# Patient Record
Sex: Male | Born: 1950 | Race: White | Hispanic: No | Marital: Married | Smoking: Current every day smoker
Health system: Southern US, Community
[De-identification: ages and names within clinical notes are randomized; demographics above are authoritative.]

## PROBLEM LIST (undated history)

## (undated) ENCOUNTER — Emergency Department: Discharge status: Absent without leave | Payer: BC Managed Care – PPO

## (undated) DIAGNOSIS — J449 Chronic obstructive pulmonary disease, unspecified: Secondary | ICD-10-CM

## (undated) DIAGNOSIS — E871 Hypo-osmolality and hyponatremia: Secondary | ICD-10-CM

## (undated) DIAGNOSIS — F329 Major depressive disorder, single episode, unspecified: Secondary | ICD-10-CM

## (undated) DIAGNOSIS — G629 Polyneuropathy, unspecified: Secondary | ICD-10-CM

## (undated) DIAGNOSIS — I714 Abdominal aortic aneurysm, without rupture, unspecified: Secondary | ICD-10-CM

## (undated) DIAGNOSIS — F32A Depression, unspecified: Secondary | ICD-10-CM

## (undated) DIAGNOSIS — Z720 Tobacco use: Secondary | ICD-10-CM

## (undated) HISTORY — DX: Abdominal aortic aneurysm, without rupture, unspecified: I71.40

## (undated) HISTORY — DX: Polyneuropathy, unspecified: G62.9

## (undated) HISTORY — PX: HEMORRHOID SURGERY: SHX153

## (undated) HISTORY — DX: Hypo-osmolality and hyponatremia: E87.1

---

## 2002-07-18 ENCOUNTER — Encounter: Payer: Self-pay | Admitting: Emergency Medicine

## 2002-07-18 ENCOUNTER — Inpatient Hospital Stay (HOSPITAL_COMMUNITY): Admission: EM | Admit: 2002-07-18 | Discharge: 2002-07-19 | Payer: Self-pay | Admitting: Emergency Medicine

## 2002-07-19 ENCOUNTER — Encounter: Payer: Self-pay | Admitting: Internal Medicine

## 2002-08-05 ENCOUNTER — Encounter: Admission: RE | Admit: 2002-08-05 | Discharge: 2002-08-05 | Payer: Self-pay | Admitting: Internal Medicine

## 2004-08-21 ENCOUNTER — Emergency Department (HOSPITAL_COMMUNITY): Admission: EM | Admit: 2004-08-21 | Discharge: 2004-08-21 | Payer: Self-pay | Admitting: Emergency Medicine

## 2005-02-27 ENCOUNTER — Encounter (INDEPENDENT_AMBULATORY_CARE_PROVIDER_SITE_OTHER): Payer: Self-pay | Admitting: *Deleted

## 2005-02-27 ENCOUNTER — Ambulatory Visit (HOSPITAL_COMMUNITY): Admission: RE | Admit: 2005-02-27 | Discharge: 2005-02-27 | Payer: Self-pay | Admitting: General Surgery

## 2005-04-13 ENCOUNTER — Encounter (INDEPENDENT_AMBULATORY_CARE_PROVIDER_SITE_OTHER): Payer: Self-pay | Admitting: Specialist

## 2005-04-13 ENCOUNTER — Ambulatory Visit (HOSPITAL_COMMUNITY): Admission: RE | Admit: 2005-04-13 | Discharge: 2005-04-13 | Payer: Self-pay | Admitting: General Surgery

## 2006-05-08 HISTORY — PX: HEMORRHOID SURGERY: SHX153

## 2007-01-06 ENCOUNTER — Emergency Department (HOSPITAL_COMMUNITY): Admission: EM | Admit: 2007-01-06 | Discharge: 2007-01-06 | Payer: Self-pay | Admitting: Emergency Medicine

## 2010-09-23 NOTE — Discharge Summary (Signed)
NAME:  Jacob Barr, Jacob Barr NO.:  [id number]   MEDICAL RECORD NO.:  [id number]                   PATIENT TYPE:  INP   LOCATION:  3729                                 FACILITY:  [facility]   PHYSICIAN:  Douglass Rivers, M.D.                DATE OF BIRTH:  08-01-1950   DATE OF ADMISSION:  07/18/2002  DATE OF DISCHARGE:  07/19/2002                                 DISCHARGE SUMMARY   DISCHARGE DIAGNOSES:  1. Episodic nausea with associated diaphoresis and dizziness times three.  2. Tobacco abuse.  3. Question of nodule on portable chest x-ray on July 18, 2002, attributed     to blood vessel and repeat PA and lateral on July 19, 2002.   DISCHARGE MEDICATIONS:  Enteric coated aspirin 325 mg by mouth each day.   OTHER INSTRUCTIONS:  The patient was instructed to return immediately for  evaluation if he developed sudden shortness of breath, chest pain or repeat  of his previous symptoms. I strongly encouraged the patient to consider  stopping smoking and to seek help with his primary care physician about  options that could help him.   FOLLOW UP:  1. [facility]. They are to call the patient with an appointment for     a stress test.  2. The patient has a hospital follow-up at the [facility] on August 05, 2002.  3. The patient was instructed to call Dr. Jeannetta Nap' office for an appointment     in approximately 3-4 weeks.   HISTORY OF PRESENT ILLNESS:  Mr. Shinault is a 60 year old white male smoker  who presented to the [facility] emergency room with a 10-15 minute  episode of nausea associated with dizziness on the morning prior to  admission. This episode was not exertional and relieved by fresh air. It  subsided gradually and at the time of admission had completely resolved. He  has had two previous similar episodes separated by a few weeks. They both  occurred while he was having a bowel movement on the commode.  These two  episodes were also associated with diaphoresis in addition to weakness,  dizziness, and nausea. The patient denies any chest pain or palpitations.  Denies any shortness of breath. Denies vomiting. He did admit to having  numbness to the little finger of his right hand on the morning upon awaking  on the day of admission. This numbness self subsided. The patient denies any  past history of diabetes mellitus, hypertension, or heart disease. He states  that he had chest pain in the past but says that the workup was negative and  does not recall any testing beyond EKG. There is no family history of  coronary artery disease.   MEDICATIONS:  The patient started taking 81 mg of aspirin per day two days  prior to admission.   SOCIAL HISTORY:  Significant for a 60 pack per year history of cigarette  smoking. The patient is married and does not currently have health  insurance.   REVIEW OF SYSTEMS:  As per HPI.   PHYSICAL EXAMINATION:  VITAL SIGNS: Pulse 101, respiratory rate 20, blood  pressure 137/65, temperature 97.0, and O2 sat 100% on room air.  GENERAL: Awake, alert, and in no acute distress. Pleasant, thin, well  nourished.  HEENT: Eyes were clear. Conjunctiva pink. Extraocular muscles intact.  NECK: Normal range of motion. No LAD.  LUNGS: Clear to auscultation and percussion bilaterally. Expiratory  respirations were slightly increased.  CARDIAC: Regular rate and rhythm but distant. No murmur, rub, or gallop. EKG  revealed sinus bradycardia with a rate of 55. There are no ST changes.  ABDOMEN: Benign.  EXTREMITIES: Showed strong 2+ radial and dorsalis pedis pulse. The patient  did have decreased hair to his bilateral feet.  NEURO: Showed no focal deficits.  RECTAL: Revealed heme negative. Normal tone.   LABORATORY DATA:  Normal creatinine of 1.2. WBC count 10.2. Hemoglobin 14.1.  Initial and subsequent cardiac enzymes were all negative with troponin I of  less than  0.01.   DIAGNOSTIC IMPRESSION:  Portable x-ray showed a 5 mm nodule in the left  lower lobe.   ASSESSMENT:  A 60 year old male with the only cardiac risk factor of long  smoking history who presented with a history of episodic nausea and  diaphoresis. The following issues were addressed.  1. Nausea/diaphoresis. As this was considered an atypical presentation of     cardiac ischemia, however, due to his risk factors of being male and     being a smoker, the patient was admitted for observation and to rule out     myocardial infarction with serial EKG's, telemetry and enzymes. Also     arrhythmia was to be ruled out with telemetry. The patient had no     evidence of acute myocardial infarction. He had no repeat episodes of his     symptoms during the hospital course. At the time of discharge, he was     feeling well. Due to his risk for smoking and age, it was recommended     that the patient had a stress test for risk stratification. Included in     the workup was a TSH that was normal at 0.706 and a fasting lipid panel     with total cholesterol of 187, triglycerides 77, HDL 49, and LDL 123.  2. Tobacco abuse. The patient was repeatedly approached and encouraged to     consider the association of tobacco use. A Nicotine patch was offered.     However, the patient declined. The patient appeared to be in the pre     contemplating stage of this behavior change. Would recommend continuing     to address this as it would be beneficial to his future health.  3. A 5 mm nodule in the left lower lobe. Initially this was detected on a     portable film done in the emergency room. Initially radiology recommended     just getting a repeat film in approximately 3-6 months or comparing it to     an old film. On hospital day two, the patient underwent a repeat chest x-     ray with a PA and lateral which did not reveal this same said nodule. At    that time, radiology felt that the initial nodule was  likely a blood  vessel. No further follow-up was recommended.   DISPOSITION:  The patient was discharged to home with previously described  recommendations and follow-up.                                                Douglass Rivers, M.D.    CH/MEDQ  D:  07/22/2002  T:  07/23/2002  Job:  [id number]   cc:   Buren Kos, M.D.  [address] [address]  [place], [place]  [address]  Fax: [phone]

## 2010-09-23 NOTE — Op Note (Signed)
NAME:  Jacob Barr, Jacob Barr               ACCOUNT NO.:  [id number]   MEDICAL RECORD NO.:  [id number]          PATIENT TYPE:  AMB   LOCATION:  DAY                          FACILITY:  [facility]   PHYSICIAN:  Ollen Gross. Vernell Morgans, M.D. DATE OF BIRTH:  20-Feb-1951   DATE OF PROCEDURE:  03/01/2005  DATE OF DISCHARGE:  02/27/2005                                 OPERATIVE REPORT   PREOPERATIVE DIAGNOSIS:  Internal prolapsing hemorrhoids.   POSTOP DIAGNOSIS:  Internal prolapsing hemorrhoids.   PROCEDURE:  PPH hemorrhoidectomy.   SURGEON:  Ollen Gross. Carolynne Edouard, M.D.   ANESTHESIA:  General endotracheal.   DESCRIPTION OF PROCEDURE:  After informed consent was obtained, the patient  was brought to the operating room and left in supine position on stretcher.  After adequate induction of general anesthesia, the patient was moved onto  the operating room table into a prone position; and all pressure points were  padded.  His buttock were retracted laterally with tape. The perirectal area  was then prepped with Betadine and draped in he usual sterile manner.  Initially a small bullet retractor was placed in the rectum and the patient  was noted to have internal hemorrhoids, but no other abnormalities were  noted. The perirectal area was then infiltrated with 1 mL of Wydase as well  as 9 mL of 1/4% Marcaine with epinephrine; and the tissue was massaged  gently.   Next a deep Fansler retractor was placed in the rectum. A 2-0 Prolene  pursestring stitch was then placed approximately 4-5 cm deep to the dentate  line. Care was taken to make sure each stitch started where the previous  stitch had ended. Once this pursestring stitch was in good position, the  Fansler retractor was removed. Care was also taken to make sure each stitch  gathered only mucosa and submucosa and no muscle. Next the ends of the  pursestring stitch were brought through the clear plastic retractor and  white dilator.  The retractor and dilator were  then placed in the rectum.  The white dilator was removed. Next, the [facility] stapling device was placed in  the rectum and felt to have dropped beneath the pursestring stitch. The  pursestring stitch was then cinched down and tied; and the ends of the  pursestring stitch were brought to the lateral holes in the [facility] stapling  device. An air knot was then tied as well as a hemostat applied for  retraction purposes on the ends of the pursestring stitch.  The PPH stapling  device was then advanced into the rectum and closed, gradually, all way.  Once this was accomplished, the stapling device appeared to be in very good  position. A minute was allowed to pass; the stapling device was then fired;  another minute was allowed to pass.  The PPH stapling device was then opened  about a half-turn and then removed from the rectum.  The specimen was then  examined. There was a complete ring that was very uniform with hemorrhoidal  tissue but no muscle. This was sent to pathology for further evaluation.   Next  the shorter Fansler retractor was used to examine the staple line.  Several bleeding points were controlled with figure-of-eight 4-0 Vicryl  stitches. Once this was accomplished, the staple line was very hemostatic  and looked good. The perirectal area was then infiltrated with more 1/4%  Marcaine with epinephrine; 1% lidocaine jelly was  placed inside the rectum along with a small piece of Gelfoam; and sterile  dressings were applied. The patient tolerated well.  At the end of the case  all needle, sponge and instrument counts were correct. The patient was then  moved back into a supine position, awakened, taken recovery in stable  condition.      Ollen Gross. Vernell Morgans, M.D.  Electronically Signed     PST/MEDQ  D:  03/01/2005  T:  03/01/2005  Job:  [id number]

## 2010-09-23 NOTE — Op Note (Signed)
NAME:  Jacob Barr, Jacob Barr               ACCOUNT NO.:  [id number]   MEDICAL RECORD NO.:  [id number]          PATIENT TYPE:  AMB   LOCATION:  DAY                          FACILITY:  [facility]   PHYSICIAN:  Ollen Gross. Vernell Morgans, M.D. DATE OF BIRTH:  04/16/1951   DATE OF PROCEDURE:  04/13/2005  DATE OF DISCHARGE:  04/13/2005                                 OPERATIVE REPORT   PREOPERATIVE DIAGNOSIS:  Prolapsing internal and external hemorrhoids.   POSTOPERATIVE DIAGNOSIS:  Prolapsing internal and external hemorrhoids.   PROCEDURE:  Two-column hemorrhoidectomy and internal hemorrhoid banding x1.   SURGEON:  Dr. Carolynne Edouard   ANESTHESIA:  General endotracheal.   PROCEDURE:  After informed consent was obtained, the patient was brought to  the operating room and placed in a supine position on the operating room  table.  After adequate induction of general anesthesia, the patient's  perirectal area was prepped with Betadine and draped in usual sterile  manner.  The patient's perirectal area was then infiltrated with 0.25%  Marcaine with epinephrine and 1 mL of Wydase, and the tissue was massaged  gently for several minutes.  Next, on digital exam, no masses could be  appreciated.  A bullet-type retractor was then placed within the rectum, and  the rectum was examined circumferentially.  There were two prominent areas  of internal and external hemorrhoidal tissue that seemed to prolapse when  the patient strains.  These were in the right posterior and left lateral  positions.  Each of these hemorrhoids was grasped in 2 places with Allis  clamps and elevated.  The base of the hemorrhoid at each position was then  scored with the 15 blade knife.  A hemostat was placed across the base of  the hemorrhoidal tissue and clamped.  The hemorrhoid was then excised  sharply with the Metzenbaum scissors.  Then 2-0 chromic stitches were used  to close each of these incisions in a running manner.  The last 3 mm or so  of  the wound was left open for drainage.  An extra interrupted stitch was  required on the left lateral stitch lines for hemostasis.  Once this was  accomplished though, both incision lines were very hemostatic and looked  good.  There was some prominent internal hemorrhoidal tissue anteriorly, and  this was controlled with a band.  The patient tolerated the procedure well.  At the end of the case, all needle, sponge, and instrument counts were  correct.  Lidocaine jelly and a small piece of Gelfoam were then placed  inside the rectum, and sterile dressings were applied.  The patient was then  awakened and taken to the recovery room in stable condition.      Ollen Gross. Vernell Morgans, M.D.  Electronically Signed     PST/MEDQ  D:  04/17/2005  T:  04/17/2005  Job:  [id number]

## 2011-02-17 LAB — POCT CARDIAC MARKERS
CKMB, poc: <1 — ABNORMAL LOW
Myoglobin, poc: 63.3
Operator id: 151321
Troponin i, poc: <0.05

## 2011-02-17 LAB — DIFFERENTIAL
Basophils Absolute: 0
Basophils Relative: 0
Eosinophils Absolute: 0
Eosinophils Relative: 0
Lymphocytes Relative: 29
Lymphs Abs: 2
Monocytes Absolute: 0.5
Monocytes Relative: 7
Neutro Abs: 4.5
Neutrophils Relative %: 64

## 2011-02-17 LAB — I-STAT 8, (EC8 V) (CONVERTED LAB)
Acid-base deficit: 2
BUN: 11
Bicarbonate: 21.3
Chloride: 99
Glucose, Bld: 103 — ABNORMAL HIGH
HCT: 42
Hemoglobin: 14.3
Operator id: 151321
Potassium: 4
Sodium: 129 — ABNORMAL LOW
TCO2: 22
pCO2, Ven: 32.3 — ABNORMAL LOW
pH, Ven: 7.426 — ABNORMAL HIGH

## 2011-02-17 LAB — POCT I-STAT CREATININE
Creatinine, Ser: 1.1
Operator id: 151321

## 2011-02-17 LAB — CBC
HCT: 38.4 — ABNORMAL LOW
Hemoglobin: 13.3
MCHC: 34.7
MCV: 94.1
Platelets: 261
RBC: 4.09 — ABNORMAL LOW
RDW: 13.4
WBC: 7.1

## 2011-06-26 ENCOUNTER — Other Ambulatory Visit: Payer: Self-pay | Admitting: Family Medicine

## 2011-06-27 ENCOUNTER — Ambulatory Visit
Admission: RE | Admit: 2011-06-27 | Discharge: 2011-06-27 | Disposition: A | Discharge status: Home / Self Care | Payer: BC Managed Care – PPO | Source: Ambulatory Visit | Attending: Family Medicine | Admitting: Family Medicine

## 2011-06-27 MED ORDER — IOHEXOL 300 MG/ML  SOLN
100.0000 mL | Freq: Once | INTRAMUSCULAR | Status: AC | PRN
  Administered 2011-06-27: 100 mL via INTRAVENOUS

## 2011-07-02 ENCOUNTER — Emergency Department (HOSPITAL_COMMUNITY): Payer: BC Managed Care – PPO

## 2011-07-02 ENCOUNTER — Encounter (HOSPITAL_COMMUNITY): Payer: Self-pay | Admitting: Emergency Medicine

## 2011-07-02 ENCOUNTER — Observation Stay (HOSPITAL_COMMUNITY)
Admission: EM | Admit: 2011-07-02 | Discharge: 2011-07-03 | DRG: 543 | Disposition: A | Discharge status: Home / Self Care | Payer: BC Managed Care – PPO | Source: Ambulatory Visit | Attending: Internal Medicine | Admitting: Internal Medicine

## 2011-07-02 ENCOUNTER — Other Ambulatory Visit: Payer: Self-pay

## 2011-07-02 DIAGNOSIS — N179 Acute kidney failure, unspecified: Secondary | ICD-10-CM | POA: Diagnosis present

## 2011-07-02 DIAGNOSIS — R0789 Other chest pain: Principal | ICD-10-CM | POA: Diagnosis present

## 2011-07-02 DIAGNOSIS — I10 Essential (primary) hypertension: Secondary | ICD-10-CM | POA: Diagnosis present

## 2011-07-02 DIAGNOSIS — Z8719 Personal history of other diseases of the digestive system: Secondary | ICD-10-CM | POA: Insufficient documentation

## 2011-07-02 DIAGNOSIS — K5900 Constipation, unspecified: Secondary | ICD-10-CM

## 2011-07-02 DIAGNOSIS — R109 Unspecified abdominal pain: Secondary | ICD-10-CM | POA: Diagnosis present

## 2011-07-02 DIAGNOSIS — E785 Hyperlipidemia, unspecified: Secondary | ICD-10-CM | POA: Diagnosis present

## 2011-07-02 DIAGNOSIS — Z720 Tobacco use: Secondary | ICD-10-CM | POA: Diagnosis present

## 2011-07-02 DIAGNOSIS — F17200 Nicotine dependence, unspecified, uncomplicated: Secondary | ICD-10-CM | POA: Diagnosis present

## 2011-07-02 DIAGNOSIS — R748 Abnormal levels of other serum enzymes: Secondary | ICD-10-CM

## 2011-07-02 DIAGNOSIS — R079 Chest pain, unspecified: Secondary | ICD-10-CM

## 2011-07-02 DIAGNOSIS — K5909 Other constipation: Secondary | ICD-10-CM | POA: Diagnosis present

## 2011-07-02 HISTORY — DX: Tobacco use: Z72.0

## 2011-07-02 HISTORY — DX: Chronic obstructive pulmonary disease, unspecified: J44.9

## 2011-07-02 LAB — HEMOGLOBIN A1C
Hgb A1c MFr Bld: 6.2 % — ABNORMAL HIGH (ref <5.7)
Mean Plasma Glucose: 131 mg/dL — ABNORMAL HIGH (ref <117)

## 2011-07-02 LAB — CBC
HCT: 36.5 % — ABNORMAL LOW (ref 39.0–52.0)
Hemoglobin: 13.4 g/dL (ref 13.0–17.0)
MCH: 32 pg (ref 26.0–34.0)
MCHC: 36.7 g/dL — ABNORMAL HIGH (ref 30.0–36.0)
MCV: 87.1 fL (ref 78.0–100.0)
Platelets: 269 10*3/uL (ref 150–400)
RBC: 4.19 MIL/uL — ABNORMAL LOW (ref 4.22–5.81)
RDW: 12.9 % (ref 11.5–15.5)
WBC: 5.8 10*3/uL (ref 4.0–10.5)

## 2011-07-02 LAB — URINALYSIS, ROUTINE W REFLEX MICROSCOPIC
Bilirubin Urine: NEGATIVE
Glucose, UA: NEGATIVE mg/dL
Hgb urine dipstick: NEGATIVE
Ketones, ur: NEGATIVE mg/dL
Leukocytes, UA: NEGATIVE
Nitrite: NEGATIVE
Protein, ur: NEGATIVE mg/dL
Specific Gravity, Urine: 1.005 (ref 1.005–1.030)
Urobilinogen, UA: 0.2 mg/dL (ref 0.0–1.0)
pH: 7 (ref 5.0–8.0)

## 2011-07-02 LAB — COMPREHENSIVE METABOLIC PANEL
ALT: 125 U/L — ABNORMAL HIGH (ref 0–53)
AST: 67 U/L — ABNORMAL HIGH (ref 0–37)
Albumin: 4.1 g/dL (ref 3.5–5.2)
Alkaline Phosphatase: 107 U/L (ref 39–117)
BUN: 14 mg/dL (ref 6–23)
CO2: 21 mEq/L (ref 19–32)
Calcium: 9.3 mg/dL (ref 8.4–10.5)
Chloride: 94 mEq/L — ABNORMAL LOW (ref 96–112)
Creatinine, Ser: 1.21 mg/dL (ref 0.50–1.35)
GFR calc Af Amer: 73 mL/min — ABNORMAL LOW (ref >90)
GFR calc non Af Amer: 63 mL/min — ABNORMAL LOW (ref >90)
Glucose, Bld: 141 mg/dL — ABNORMAL HIGH (ref 70–99)
Potassium: 3.9 mEq/L (ref 3.5–5.1)
Sodium: 126 mEq/L — ABNORMAL LOW (ref 135–145)
Total Bilirubin: 0.2 mg/dL — ABNORMAL LOW (ref 0.3–1.2)
Total Protein: 7.4 g/dL (ref 6.0–8.3)

## 2011-07-02 LAB — RAPID URINE DRUG SCREEN, HOSP PERFORMED
Amphetamines: NOT DETECTED
Barbiturates: NOT DETECTED
Benzodiazepines: NOT DETECTED
Cocaine: NOT DETECTED
Opiates: POSITIVE — AB
Tetrahydrocannabinol: NOT DETECTED

## 2011-07-02 LAB — DIFFERENTIAL
Basophils Absolute: 0 10*3/uL (ref 0.0–0.1)
Basophils Relative: 0 % (ref 0–1)
Eosinophils Absolute: 0 10*3/uL (ref 0.0–0.7)
Eosinophils Relative: 0 % (ref 0–5)
Lymphocytes Relative: 27 % (ref 12–46)
Lymphs Abs: 1.6 10*3/uL (ref 0.7–4.0)
Monocytes Absolute: 0.7 10*3/uL (ref 0.1–1.0)
Monocytes Relative: 12 % (ref 3–12)
Neutro Abs: 3.5 10*3/uL (ref 1.7–7.7)
Neutrophils Relative %: 61 % (ref 43–77)

## 2011-07-02 LAB — CARDIAC PANEL(CRET KIN+CKTOT+MB+TROPI)
CK, MB: 2.3 ng/mL (ref 0.3–4.0)
CK, MB: 2.1 ng/mL (ref 0.3–4.0)
Relative Index: INVALID (ref 0.0–2.5)
Relative Index: INVALID (ref 0.0–2.5)
Total CK: 64 U/L (ref 7–232)
Total CK: 67 U/L (ref 7–232)
Troponin I: <0.3 ng/mL (ref <0.30)
Troponin I: <0.3 ng/mL (ref <0.30)

## 2011-07-02 LAB — SODIUM, URINE, RANDOM: Sodium, Ur: 37 mEq/L

## 2011-07-02 LAB — LIPASE, BLOOD: Lipase: 25 U/L (ref 11–59)

## 2011-07-02 LAB — TSH: TSH: 1.002 u[IU]/mL (ref 0.350–4.500)

## 2011-07-02 LAB — GAMMA GT: GGT: 132 U/L — ABNORMAL HIGH (ref 7–51)

## 2011-07-02 LAB — MAGNESIUM: Magnesium: 2.2 mg/dL (ref 1.5–2.5)

## 2011-07-02 LAB — CREATININE, URINE, RANDOM: Creatinine, Urine: 20.5 mg/dL

## 2011-07-02 LAB — TROPONIN I
Troponin I: <0.3 ng/mL (ref <0.30)
Troponin I: <0.3 ng/mL (ref <0.30)

## 2011-07-02 MED ORDER — ZOLPIDEM TARTRATE 5 MG PO TABS
5.0000 mg | ORAL_TABLET | Freq: Once | ORAL | Status: AC | PRN
  Administered 2011-07-02: 5 mg via ORAL
  Filled 2011-07-02: qty 1

## 2011-07-02 MED ORDER — ONDANSETRON HCL 4 MG/2ML IJ SOLN: 4.0000 mg | Freq: Four times a day (QID) | INTRAMUSCULAR | Status: DC | PRN

## 2011-07-02 MED ORDER — POLYETHYLENE GLYCOL 3350 17 G PO PACK
17.0000 g | PACK | Freq: Two times a day (BID) | ORAL | Status: DC
  Administered 2011-07-02 – 2011-07-03 (×2): 17 g via ORAL
  Filled 2011-07-02 (×3): qty 1

## 2011-07-02 MED ORDER — ONDANSETRON HCL 4 MG PO TABS: 4.0000 mg | ORAL_TABLET | Freq: Four times a day (QID) | ORAL | Status: DC | PRN

## 2011-07-02 MED ORDER — ALUM & MAG HYDROXIDE-SIMETH 200-200-20 MG/5ML PO SUSP: 30.0000 mL | Freq: Four times a day (QID) | ORAL | Status: DC | PRN

## 2011-07-02 MED ORDER — MORPHINE SULFATE 4 MG/ML IJ SOLN
4.0000 mg | Freq: Once | INTRAMUSCULAR | Status: AC
  Administered 2011-07-02: 4 mg via INTRAVENOUS
  Filled 2011-07-02: qty 1

## 2011-07-02 MED ORDER — ENOXAPARIN SODIUM 40 MG/0.4ML ~~LOC~~ SOLN
40.0000 mg | SUBCUTANEOUS | Status: DC
  Administered 2011-07-02: 40 mg via SUBCUTANEOUS
  Filled 2011-07-02 (×2): qty 0.4

## 2011-07-02 MED ORDER — PANTOPRAZOLE SODIUM 40 MG PO TBEC
40.0000 mg | DELAYED_RELEASE_TABLET | Freq: Every day | ORAL | Status: DC
  Administered 2011-07-02 – 2011-07-03 (×2): 40 mg via ORAL
  Filled 2011-07-02: qty 1

## 2011-07-02 MED ORDER — ONDANSETRON HCL 4 MG/2ML IJ SOLN
4.0000 mg | Freq: Once | INTRAMUSCULAR | Status: AC
  Administered 2011-07-02: 4 mg via INTRAVENOUS
  Filled 2011-07-02: qty 2

## 2011-07-02 MED ORDER — SODIUM CHLORIDE 0.9 % IJ SOLN: 3.0000 mL | Freq: Two times a day (BID) | INTRAMUSCULAR | Status: DC

## 2011-07-02 MED ORDER — FLEET ENEMA 7-19 GM/118ML RE ENEM
1.0000 | ENEMA | Freq: Once | RECTAL | Status: AC
  Administered 2011-07-02: 18:00:00 via RECTAL
  Filled 2011-07-02: qty 1

## 2011-07-02 MED ORDER — ASPIRIN 81 MG PO CHEW
324.0000 mg | CHEWABLE_TABLET | Freq: Once | ORAL | Status: AC
  Administered 2011-07-02: 324 mg via ORAL
  Filled 2011-07-02: qty 4

## 2011-07-02 MED ORDER — ASPIRIN EC 81 MG PO TBEC
81.0000 mg | DELAYED_RELEASE_TABLET | Freq: Every day | ORAL | Status: DC
  Administered 2011-07-03: 81 mg via ORAL
  Filled 2011-07-02: qty 1

## 2011-07-02 MED ORDER — SODIUM CHLORIDE 0.9 % IV SOLN
INTRAVENOUS | Status: AC
  Administered 2011-07-02: 18:00:00 via INTRAVENOUS

## 2011-07-02 MED ORDER — DOCUSATE SODIUM 100 MG PO CAPS
100.0000 mg | ORAL_CAPSULE | Freq: Two times a day (BID) | ORAL | Status: DC
  Administered 2011-07-02 – 2011-07-03 (×2): 100 mg via ORAL
  Filled 2011-07-02 (×3): qty 1

## 2011-07-02 MED ORDER — MORPHINE SULFATE 2 MG/ML IJ SOLN
1.0000 mg | INTRAMUSCULAR | Status: DC | PRN
  Administered 2011-07-02: 1 mg via INTRAVENOUS
  Filled 2011-07-02: qty 1

## 2011-07-02 NOTE — ED Notes (Signed)
Admitting MD at the bedside.  

## 2011-07-02 NOTE — H&P (Signed)
Hospital Admission Note Date: 07/02/2011  Patient name: Jacob Barr Medical record number: [id number] Date of birth: 07-13-1950 Age: 61 y.o. Gender: male PCP: Dr. Shelah Lewandowsky ([facility]) Medical Service: Internal Medicine Teaching [facility]  Attending physician:  Doneen Poisson   1st Contact:   Clydie Braun SchoolerPager: [phone] 2nd Contact:   Bard Herbert  Pager: [phone] After 5 pm or weekends: 1st Contact:      Pager: [phone] 2nd Contact:      Pager: [phone]  Chief Complaint: chest pain  History of Present Illness: Jacob Barr is a 61 year old with ~70 pack year smoking history currently smokes 1-1 1/2 ppd without known coronary disease, hypertension, hyperlipidemia or diabetes mellitus who presents to the ED with c/o of waxing and waning substernal chest pressure since the evening prior to admission.  He report that he has similar symptoms "off and on" but last night in particular he could not "get comfortable when laying down in bed.  He denies radiation of the pain, denies diaphoresis, sob, vomiting or pre-syncope.  He does endorse mild nausea, numbness of bilateral hands and feet and drinking "lots of coffee".  He denies changes in his bowel habits, dysuria, hematuria, or blood in stool.  He does report chronic constipation and new onset mild abdominal discomfort predominately in epigastrium and left abdominal quadrants that often improves when eating, having bowel movements and passing gas.  Of note, he reports recent colonoscopy that "didnt find anything" in January 2013.  He further states that   CT of abdomen 6 days ago per his PCP showed "colitis" for which he was prescribed Bactrim and Flagyl. He reports that he has undergone a heart workup per [facility] years ago with a negative Exercise Stress Test.   Meds: Medications Prior to Admission  Medication Dose Route Frequency Provider Last Rate Last Dose  . aspirin chewable tablet 324 mg  324 mg Oral Once Carolee Rota, [place]   324 mg at 07/02/11 1052  . morphine 4 MG/ML injection 4 mg  4 mg Intravenous Once Carolee Rota, PA   4 mg at 07/02/11 1318  . ondansetron (ZOFRAN) injection 4 mg  4 mg Intravenous Once Carolee Rota, PA   4 mg at 07/02/11 1316   No current outpatient prescriptions on file as of 07/02/2011.    Allergies: Review of patient's allergies indicates no known allergies. Past Medical History  Diagnosis Date  . Emphysema    Past Surgical History  Procedure Date  . Hemorrhoid surgery    History reviewed. No pertinent family history. History   Social History  . Marital Status: Married    Spouse Name: N/A    Number of Children: N/A  . Years of Education: N/A   Occupational History  . Not on file.   Social History Main Topics  . Smoking status: Current Everyday Smoker  . Smokeless tobacco: Not on file  . Alcohol Use: No  . Drug Use: No  . Sexually Active:    Other Topics Concern  . Not on file   Social History Narrative  . No narrative on file    Review of Systems: As per HPI, in addition: Positive chills without fever, dyspnea on heavy exertion such as chopping wood, "numbness of fingers and feet" for years, positive flatulence, reports having "dark urine at times"  Physical Exam: Blood pressure 124/81, pulse 74, temperature 98.4 F (36.9 C), resp. rate 20, SpO2 97.00%. General: Vital signs reviewed and noted. Well-developed, well-nourished,  white male in no acute distress lying flight on stretcher, wife at bedside Head: Normocephalic, atraumatic. Eyes: PERRL, EOMI, No signs of anemia or jaundice. Throat: Dry mucous membranes, oropharynx nonerythematous, no exudate appreciated.  Neck: No deformities, masses, or tenderness noted.Supple, No carotid Bruits, no JVD. Lungs: Normal respiratory effort. Decreased movement of air, clear to auscultation BL without crackles or wheezes. Heart: Distant heart sounds, RRR. S1 and S2 normal without gallop, murmur, or rubs  appreciated Abdomen: BS normoactive. Soft, Nondistended, non-tender. No masses or organomegaly. Extremities: No pretibial edema, Distal pulses 2+ dorsal pedalis Neurologic: nonfocal, alert, appropriate and cooperative throughout examination.   Lab results: Basic Metabolic Panel:  Basename 07/02/11 1040  NA 126*  K 3.9  CL 94*  CO2 21  GLUCOSE 141*  BUN 14  CREATININE 1.21  CALCIUM 9.3  MG --  PHOS --   Liver Function Tests:  Basename 07/02/11 1040  AST 67*  ALT 125*  ALKPHOS 107  BILITOT 0.2*  PROT 7.4  ALBUMIN 4.1   No results found for this basename: LIPASE:2,AMYLASE:2 in the last 72 hours No results found for this basename: AMMONIA:2 in the last 72 hours CBC:  Basename 07/02/11 1040  WBC 5.8  NEUTROABS 3.5  HGB 13.4  HCT 36.5*  MCV 87.1  PLT 269   Cardiac Enzymes:  Basename 07/02/11 1041  CKTOTAL --  CKMB --  CKMBINDEX --  TROPONINI <0.30   BNP: No results found for this basename: PROBNP:3 in the last 72 hours D-Dimer: No results found for this basename: DDIMER:2 in the last 72 hours CBG: No results found for this basename: GLUCAP:6 in the last 72 hours Hemoglobin A1C: No results found for this basename: HGBA1C in the last 72 hours Fasting Lipid Panel: No results found for this basename: CHOL,HDL,LDLCALC,TRIG,CHOLHDL,LDLDIRECT in the last 72 hours Thyroid Function Tests: No results found for this basename: TSH,T4TOTAL,FREET4,T3FREE,THYROIDAB in the last 72 hours Anemia Panel: No results found for this basename: VITAMINB12,FOLATE,FERRITIN,TIBC,IRON,RETICCTPCT in the last 72 hours Coagulation: No results found for this basename: LABPROT:2,INR:2 in the last 72 hours Urine Drug Screen: Drugs of Abuse  No results found for this basename: labopia, cocainscrnur, labbenz, amphetmu, thcu, labbarb    Alcohol Level: No results found for this basename: ETH:2 in the last 72 hours Urinalysis: No results found for this basename:  COLORURINE:2,APPERANCEUR:2,LABSPEC:2,PHURINE:2,GLUCOSEU:2,HGBUR:2,BILIRUBINUR:2,KETONESUR:2,PROTEINUR:2,UROBILINOGEN:2,NITRITE:2,LEUKOCYTESUR:2 in the last 72 hours   Imaging results:  Dg Chest 2 View  07/02/2011  *RADIOLOGY REPORT*  Clinical Data: Smoker with history of emphysema, presenting with mid chest pain.  CHEST - 2 VIEW 07/02/2011:  Comparison: Two-view chest x-ray 01/06/2007, 07/19/2002 [facility].  Findings: Cardiomediastinal silhouette unremarkable, unchanged. Lungs hyperinflated with emphysematous changes diffusely, unchanged.  Lungs clear.  No pleural effusions.  Visualized bony thorax intact.  IMPRESSION: COPD/emphysema.  No acute cardiopulmonary disease.  Original Report Authenticated By: Arnell Sieving, M.D.    Other results: EKG: normal EKG, normal sinus rhythm  Assessment & Plan by Problem: 61 year old male with no CAD risk factors besides smoking and age admitted with chest pain for ~ 10 hours and recent abdominal pain.  #1. Chest Pain, atypical: clinical picture suspicious for GERD versus functional gastrointestinal pain likely secondary to increased abdominal pressure from constipation. CT of abdomen with prominence of stool throughout. EKG without signs of myocardial ischemia, cardiac troponins negative x2. Likelihood of pulmonary embolism low given Wells Score of 0. CXR without evidence of aortic dissection, pneumothorax  or pneumonia. CT of abd without abn of liver, gallbladder  or or bilary system which could present as referred pain and pain not reproducible on palpation thus costochondritis less likely. Currently on Trimethoprim and metronidazole for "colitis". PLAN -will further risk stratify with lipid panel, HgbA1c. -consider US abdomen -check UDS -start Protonix 40 mg daily -Maalox prn    Lab 07/02/11 1331 07/02/11 1041  TROPONINI <0.30 <0.30     #2 Abdominal discomfort: patient with constipation which may mimic inflammation on CT of abdomen. See  #3.  He also reports recent h/o pain radiating towards his back from the left flank area which was the reason for the CT scan of the abdomen 06/27/2011.  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  The left renal vein compression can occur when the space between the aorta and vertebra narrows.The resultant left renal vein hypertension may cause some clinical symptoms, such as hematuria, proteinuria, varicocele, flank pain/back pain, autonomic dysfunction and chronic fatigue (posterior nutcracker syndrome).  PLAN -US Abdomen, r/o left renal vein stenosis with retrograde dilatation or collateral circulation. -check GGT -Check lipase to r/o pancreatic source -check Urinalysis     PROT 7.4 07/02/2011 1040   ALBUMIN 4.1 07/02/2011 1040   AST 67* 07/02/2011 1040   ALT 125* 07/02/2011 1040   ALKPHOS 107 07/02/2011 1040   BILITOT 0.2* 07/02/2011 1040    #3 Constipation: unclear etiology, given chronic nature likely inadequate fiber and fluid intake with poor bowel habits.  He is without known endocrine dysfunction such as diabetes or thyroid issues, no signs of metabolic abnormalities given normal calcium, potassium and BUN. No h/o neurologic or rheumatologic dysfunction. No report of chronic use of narcotics, anticholinergics or other medications for that matter. Pt with h/o prolapsing hemorrhoids s/p banding and hemorrhoidectomy.  He has diverticulosis on CT of abdomen 06/27/11 and had recent colonoscopy 05/2011 without colonic stricture from diverticulosis thus structural source of abdominal pain less likely. PLAN Will further assess systemic etiology -check TSH -check HgbA1c -Miralax bid   #4 Hypovolemia, likely: Although not tachycardic, Mr. Spicher appears slightly volume depleted on exam with dry mucous membranes, slightly elevated creatinine from his baseline of 1.1 and hyponatremia of 126. PLAN -IV maintenance fluids -check urine electrolytes to further assess renal status  and hypovolemia -BMET in am  Signed: Kristie Cowman 07/02/2011, 2:10 PM

## 2011-07-02 NOTE — ED Notes (Signed)
Pt reports central chest pain ongoing "for awhile." "I think its my lungs." Pt reports arm and leg numbness same amount of time.

## 2011-07-02 NOTE — ED Provider Notes (Signed)
History     CSN: [id number]  Arrival date & time 07/02/11  1016   First MD Initiated Contact with Patient 07/02/11 1026      Chief Complaint  Patient presents with  . Chest Pain    (Consider location/radiation/quality/duration/timing/severity/associated sxs/prior treatment) HPI Comments: Patient presents with approximately 10 hours of bilateral chest pain that spans the lower portion of his chest. It is dull. It is not changed with deep breathing or palpation. Patient denies injury. He denies radiation of pain, diaphoresis, nausea or vomiting. Patient denies shortness of breath or palpitations. Patient did not treated with any medications prior to arrival. Patient has a smoking history however denies hypertension, hypercholesterolemia, diabetes, family history of coronary artery disease. He had a previous workup in 2004 for similar symptoms and had an outpatient stress test he says was negative. He has had no other workup in the past 9 years. Patient thinks that the pain is coming from his lungs.  Patient is a 61 y.o. male presenting with chest pain. The history is provided by the patient.  Chest Pain The chest pain began yesterday. Duration of episode(s) is 10 hours. Chest pain occurs constantly. The chest pain is unchanged. The severity of the pain is mild. The quality of the pain is described as aching and dull. The pain does not radiate. Exacerbated by: nothing. Pertinent negatives for primary symptoms include no fever, no shortness of breath, no cough, no palpitations, no abdominal pain, no nausea and no vomiting.  Pertinent negatives for associated symptoms include no diaphoresis. He tried nothing for the symptoms. Risk factors include being elderly, male gender and smoking/tobacco exposure.  Pertinent negatives for past medical history include no CAD.  Pertinent negatives for family medical history include: no CAD in family and no diabetes in family.  Procedure history is positive for  exercise treadmill test.     Past Medical History  Diagnosis Date  . Emphysema     Past Surgical History  Procedure Date  . Hemorrhoid surgery     History reviewed. No pertinent family history.  History  Substance Use Topics  . Smoking status: Current Everyday Smoker  . Smokeless tobacco: Not on file  . Alcohol Use: No      Review of Systems  Constitutional: Negative for fever and diaphoresis.  HENT: Negative for neck pain.   Eyes: Negative for redness.  Respiratory: Negative for cough and shortness of breath.   Cardiovascular: Positive for chest pain. Negative for palpitations and leg swelling.  Gastrointestinal: Negative for nausea, vomiting and abdominal pain.  Genitourinary: Negative for dysuria.  Musculoskeletal: Negative for back pain.  Skin: Negative for rash.  Neurological: Negative for syncope and light-headedness.    Allergies  Review of patient's allergies indicates not on file.  Home Medications  No current outpatient prescriptions on file.  BP 120/83  Pulse 79  Temp 98.4 F (36.9 C)  Resp 20  SpO2 99%  Physical Exam  Nursing note and vitals reviewed. Constitutional: He is oriented to person, place, and time. He appears well-developed and well-nourished.  HENT:  Head: Normocephalic and atraumatic.  Mouth/Throat: Mucous membranes are normal. Mucous membranes are not dry.  Eyes: Conjunctivae are normal.  Neck: Trachea normal and normal range of motion. Neck supple. Normal carotid pulses and no JVD present. No muscular tenderness present. Carotid bruit is not present. No tracheal deviation present.  Cardiovascular: Normal rate, regular rhythm, S1 normal, S2 normal, normal heart sounds and intact distal pulses.  Exam reveals no  distant heart sounds and no decreased pulses.   No murmur heard. Pulses:      Radial pulses are 2+ on the right side, and 2+ on the left side.  Pulmonary/Chest: Effort normal and breath sounds normal. No respiratory  distress. He has no wheezes. He exhibits no tenderness.  Abdominal: Soft. Normal aorta and bowel sounds are normal. There is no tenderness. There is no rebound and no guarding.  Musculoskeletal: He exhibits no edema.  Neurological: He is alert and oriented to person, place, and time.  Skin: Skin is warm and dry. He is not diaphoretic. No cyanosis. No pallor.  Psychiatric: He has a normal mood and affect.    ED Course  Procedures (including critical care time)  Labs Reviewed  CBC - Abnormal; Notable for the following:    RBC 4.19 (*)    HCT 36.5 (*)    MCHC 36.7 (*)    All other components within normal limits  COMPREHENSIVE METABOLIC PANEL - Abnormal; Notable for the following:    Sodium 126 (*)    Chloride 94 (*)    Glucose, Bld 141 (*)    AST 67 (*)    ALT 125 (*)    Total Bilirubin 0.2 (*)    GFR calc non Af Amer 63 (*)    GFR calc Af Amer 73 (*)    All other components within normal limits  DIFFERENTIAL  TROPONIN I  TROPONIN I   Dg Chest 2 View  07/02/2011  *RADIOLOGY REPORT*  Clinical Data: Smoker with history of emphysema, presenting with mid chest pain.  CHEST - 2 VIEW 07/02/2011:  Comparison: Two-view chest x-ray 01/06/2007, 07/19/2002 [facility].  Findings: Cardiomediastinal silhouette unremarkable, unchanged. Lungs hyperinflated with emphysematous changes diffusely, unchanged.  Lungs clear.  No pleural effusions.  Visualized bony thorax intact.  IMPRESSION: COPD/emphysema.  No acute cardiopulmonary disease.  Original Report Authenticated By: Arnell Sieving, M.D.     1. Chest pain     10:39 AM Patient seen and examined. Work-up initiated. Medications ordered. EKG reviewed by Dr. Ignacia Palma and myself. Aspirin ordered. Initial impression is low risk for CAD.   Vital signs reviewed and are as follows: Filed Vitals:   07/02/11 1024  BP: 120/83  Pulse: 79  Temp: 98.4 F (36.9 C)  Resp: 20   1:07 PM Patient discussed and seen with Dr. Ignacia Palma. Pain  persists. Medications ordered. Will admit for chest pain rule-out. Elevated transaminases noted. Patient denies history of liver problem or hepatitis.  1:54 PM OPC to admit.   MDM  Patient with chest pain with some risk factors. Given persistent pain and lack of evaluation for the past 9 years willl admit for chest pain rule out and more definitive testing.    Medical screening examination/treatment/procedure(s) were conducted as a shared visit with non-physician practitioner(s) and myself.  I personally evaluated the patient during the encounter 61 year old man who is a smoker presents with chest pain lasting some 10 hours. Exam shows lungs clear, heart sounds WNL.   Lab workup shows negative TNI, COPD on chest x-ray, EKG non-acute.  Pt has not had prior cardiac evaluation.  Advised admission for chest pain workup/ruleout. Carleene Cooper III,M.D.     Eustace Moore Kane, [place] 07/02/11 1401  Carleene Cooper III, MD 07/03/11 2014

## 2011-07-02 NOTE — ED Notes (Signed)
Attempted to call report again. RN still in room at this time. Will return call in 10 minutes.

## 2011-07-02 NOTE — ED Provider Notes (Signed)
10:34 AM  Date: 07/02/2011  Rate:76  Rhythm: normal sinus rhythm  QRS Axis: normal  Intervals: normal  ST/T Wave abnormalities: normal  Conduction Disutrbances:none  Narrative Interpretation: Normal EKG  Old EKG Reviewed: unchanged    Carleene Cooper III, MD 07/02/11 [phone]

## 2011-07-02 NOTE — ED Notes (Signed)
Attempted to call report. RN placing and IV at this time. Will return call.

## 2011-07-02 NOTE — ED Notes (Signed)
Printed two old ekgs from [facility]. 

## 2011-07-03 ENCOUNTER — Other Ambulatory Visit: Payer: Self-pay

## 2011-07-03 ENCOUNTER — Observation Stay (HOSPITAL_COMMUNITY): Payer: BC Managed Care – PPO

## 2011-07-03 DIAGNOSIS — K5909 Other constipation: Secondary | ICD-10-CM | POA: Diagnosis present

## 2011-07-03 DIAGNOSIS — R109 Unspecified abdominal pain: Secondary | ICD-10-CM | POA: Diagnosis present

## 2011-07-03 LAB — COMPREHENSIVE METABOLIC PANEL
ALT: 88 U/L — ABNORMAL HIGH (ref 0–53)
AST: 42 U/L — ABNORMAL HIGH (ref 0–37)
Albumin: 3.5 g/dL (ref 3.5–5.2)
Alkaline Phosphatase: 80 U/L (ref 39–117)
BUN: 10 mg/dL (ref 6–23)
CO2: 23 mEq/L (ref 19–32)
Calcium: 8.9 mg/dL (ref 8.4–10.5)
Chloride: 101 mEq/L (ref 96–112)
Creatinine, Ser: 1.2 mg/dL (ref 0.50–1.35)
GFR calc Af Amer: 74 mL/min — ABNORMAL LOW (ref >90)
GFR calc non Af Amer: 64 mL/min — ABNORMAL LOW (ref >90)
Glucose, Bld: 98 mg/dL (ref 70–99)
Potassium: 4.1 mEq/L (ref 3.5–5.1)
Sodium: 131 mEq/L — ABNORMAL LOW (ref 135–145)
Total Bilirubin: 0.3 mg/dL (ref 0.3–1.2)
Total Protein: 6.1 g/dL (ref 6.0–8.3)

## 2011-07-03 LAB — CARDIAC PANEL(CRET KIN+CKTOT+MB+TROPI)
CK, MB: 2.1 ng/mL (ref 0.3–4.0)
Relative Index: INVALID (ref 0.0–2.5)
Total CK: 59 U/L (ref 7–232)
Troponin I: <0.3 ng/mL (ref <0.30)

## 2011-07-03 LAB — LIPID PANEL
Cholesterol: 154 mg/dL (ref 0–200)
HDL: 71 mg/dL (ref >39)
LDL Cholesterol: 69 mg/dL (ref 0–99)
Total CHOL/HDL Ratio: 2.2 RATIO
Triglycerides: 71 mg/dL (ref <150)
VLDL: 14 mg/dL (ref 0–40)

## 2011-07-03 MED ORDER — POLYETHYLENE GLYCOL 3350 17 G PO PACK
17.0000 g | PACK | Freq: Three times a day (TID) | ORAL | Status: DC
  Filled 2011-07-03 (×2): qty 1

## 2011-07-03 MED ORDER — FLEET ENEMA 7-19 GM/118ML RE ENEM
1.0000 | ENEMA | RECTAL | Status: AC
  Administered 2011-07-03: 1 via RECTAL
  Filled 2011-07-03: qty 1

## 2011-07-03 MED ORDER — DSS 100 MG PO CAPS: 100.0000 mg | ORAL_CAPSULE | Freq: Two times a day (BID) | ORAL | Status: AC

## 2011-07-03 MED ORDER — POLYETHYLENE GLYCOL 3350 17 G PO PACK: 17.0000 g | PACK | Freq: Three times a day (TID) | ORAL | Status: AC

## 2011-07-03 NOTE — Progress Notes (Signed)
Utilization review complete 

## 2011-07-03 NOTE — Discharge Summary (Signed)
Internal Medicine [facility] Discharge Note  Name: Jacob Barr MRN: [id number] DOB: Sep 03, 1950 61 y.o.  Date of Admission: 07/02/2011 10:16 AM Date of Discharge: 07/03/2011 Attending Physician: Rocco Serene, MD  Discharge Diagnosis: Principal Problem:  *Constipation, chronic Active Problems:  Tobacco abuse  Chest pain  AKI (acute kidney injury)  Abdominal pain   Discharge Medications: Medication List  As of 07/03/2011 12:24 PM   STOP taking these medications         metroNIDAZOLE 500 MG tablet      sulfamethoxazole-trimethoprim 800-160 MG per tablet         TAKE these medications         aspirin EC 81 MG tablet   Take 81 mg by mouth daily.      DSS 100 MG Caps   Take 100 mg by mouth 2 (two) times daily.      EQL FIBER SUPPLEMENT PO   Take 1 tablet by mouth daily.      polyethylene glycol packet   Commonly known as: MIRALAX / GLYCOLAX   Take 17 g by mouth 3 (three) times daily.            Disposition and follow-up:   Jacob Barr was discharged from [facility] in Stable and Improved condition with follow-up with PCP Dr. Shelah Lewandowsky on July 07, 2011. Improvement in his constipation should be re-assessed as a good bowel regimen is likely to improve his symptoms.  His kidney function should also be evaluated for intrinsic kidney disease starting with basic metabolic panel.  Follow-up Appointments: Follow-up Information    Follow up with Kaleen Mask, MD. (Friday 07/07/2011 at 10:30 Dr. Shelah Lewandowsky of [facility])    Contact information:   [address] [place] [address] [phone]         Discharge Orders    Future Orders Please Complete By Expires   Discharge instructions      Comments:   Follow-up with Dr. Shelah Lewandowsky this Friday 07/07/2011.  You do not have colitis therefore no need to continue antibiotics.  Your colon is full of stool and the resulting abdominal pressure is  creating pressure on a blood vessel (renal vein). This may cause left sided pain, back discomfort and dark urine at times.  The best thing to do is move your bowels frequently.      Consultations:    Procedures Performed:  Dg Chest 2 View  07/02/2011  *RADIOLOGY REPORT*  Clinical Data: Smoker with history of emphysema, presenting with mid chest pain.  CHEST - 2 VIEW 07/02/2011:  Comparison: Two-view chest x-ray 01/06/2007, 07/19/2002 [facility].  Findings: Cardiomediastinal silhouette unremarkable, unchanged. Lungs hyperinflated with emphysematous changes diffusely, unchanged.  Lungs clear.  No pleural effusions.  Visualized bony thorax intact.  IMPRESSION: COPD/emphysema.  No acute cardiopulmonary disease.  Original Report Authenticated By: Arnell Sieving, M.D.   US Abdomen Complete  07/03/2011  *RADIOLOGY REPORT*  Clinical Data:  Abnormal LFTs, acute kidney injury  COMPLETE ABDOMINAL ULTRASOUND  Comparison:  CT abdomen pelvis dated 06/27/2011  Findings:  Gallbladder:  Tiny 3 mm polyp versus nonshadowing gallstone.  No pericholecystic fluid or gallbladder wall thickening.  Negative sonographic Murphy's sign.  Common bile duct:  Measures 3 mm.  Liver:  No focal lesion identified.  Within normal limits in parenchymal echogenicity.  IVC:  Appears normal.  Pancreas:  Incompletely visualized but grossly unremarkable.  Spleen:  Measures 5.9 cm.  Right Kidney:  Measures 9.8 cm.  No mass or hydronephrosis.  Left Kidney:  Measures 9.9 cm.  No mass or hydronephrosis.  Abdominal aorta:  No aneurysm identified.  Atherosclerosis.  IMPRESSION: 3 mm polyp versus nonshadowing gallstone.  No associated findings to suggest acute cholecystitis.  No hydronephrosis.  Original Report Authenticated By: Charline Bills, M.D.   Ct Abdomen Pelvis W Contrast  06/27/2011  *RADIOLOGY REPORT*  Clinical Data: Left abdominal pain.  Low back pain.  CT ABDOMEN AND PELVIS WITH CONTRAST  Technique:  Multidetector CT  imaging of the abdomen and pelvis was performed following the standard protocol during bolus administration of intravenous contrast.  Contrast: OMNIPAQUE IOHEXOL 300 MG/ML IV SOLN  Comparison: Multiple exams, including 08/21/2004  Findings: Emphysema noted in the lung bases.  The liver, spleen, pancreas, and adrenal glands appear unremarkable.  The gallbladder and biliary system appear unremarkable.  The kidneys appear unremarkable, as do the proximal ureters.  Retroaortic left renal vein noted.  Atherosclerotic calcification of the abdominal aorta is present. No pathologic retroperitoneal or porta hepatis adenopathy is identified.  No pathologic pelvic adenopathy is identified.  Orally administered contrast extends to the distal small bowel but does not reach the terminal ileum or colon.  The appendix appears normal.  Prominence of stool throughout the colon suggests constipation. There is wall thickening in the transverse and descending colon, query mild colitis. Colonic diverticulosis is present.  There is a borderline dilated loop of left lower quadrant small bowel.  IMPRESSION:  1.  Wall thickening in the transverse and descending colon, suspicious for colitis. 2.  Prominence of stool throughout the colon suggests constipation. 3.  Colonic diverticulosis. 4.  Emphysema. 5.  Borderline dilated loop of left lower quadrant small bowel, nonspecific.  Original Report Authenticated By: Dellia Cloud, M.D.    Admission HPI: Jacob Barr is a 61 year old with ~70 pack year smoking history currently smokes 1-1 1/2 ppd without known coronary disease, hypertension, hyperlipidemia or diabetes mellitus who presents to the ED with c/o of waxing and waning substernal chest pressure since the evening prior to admission. He report that he has similar symptoms "off and on" but last night in particular he could not "get comfortable when laying down in bed. He denies radiation of the pain, denies diaphoresis, sob,  vomiting or pre-syncope. He does endorse mild nausea, numbness of bilateral hands and feet and drinking "lots of coffee". He denies changes in his bowel habits, dysuria, hematuria, or blood in stool. He does report chronic constipation and new onset mild abdominal discomfort predominately in epigastrium and left abdominal quadrants that often improves when eating, having bowel movements and passing gas. Of note, he reports recent colonoscopy that "didnt find anything" in January 2013. He further states that CT of abdomen 6 days ago per his PCP showed "colitis" for which he was prescribed Bactrim and Flagyl. He reports that he has undergone a heart workup per [facility] years ago with a negative Exercise Stress Test.  Admission Physical Exam:  Blood pressure 124/81, pulse 74, temperature 98.4 F (36.9 C), resp. rate 20, SpO2 97.00%. General: Vital signs reviewed and noted. Well-developed, well-nourished, white male in no acute distress lying flight on stretcher, wife at bedside Head: Normocephalic, atraumatic. Eyes: PERRL, EOMI, No signs of anemia or jaundice. Throat: Dry mucous membranes, oropharynx nonerythematous, no exudate appreciated.  Neck: No deformities, masses, or tenderness noted.Supple, No carotid Bruits, no JVD. Lungs: Normal respiratory effort. Decreased movement of air, clear to auscultation BL without  crackles or wheezes. Heart: Distant heart sounds, RRR. S1 and S2 normal without gallop, murmur, or rubs appreciated Abdomen: BS normoactive. Soft, Nondistended, non-tender. No masses or organomegaly. Extremities: No pretibial edema, Distal pulses 2+ dorsal pedalis Neurologic: nonfocal, alert, appropriate and cooperative throughout examination.   Hospital Course by problem list:   #1. Chest Pain, atypical: Mr. Eveleth was admitted with a clinical picture suspicious for GERD versus functional gastrointestinal pain likely secondary to increased abdominal pressure from  constipation. CT of abdomen demonstrated prominence of stool throughout. Given his extensive smoking history and age, he was risk stratified for coronary artery disease. EKG was without signs of myocardial ischemia and his cardiac troponins were negative x3. Likelihood of pulmonary embolism was low risk given Wells Score of 0. CXR was without evidence of aortic dissection, pneumothorax or pneumonia.  His pain was not reproducible on palpation thus costochondritis less likely. CT of abdomen was without abnormalities of liver, gallbladder or bilary system which could present as referred pain. He was started on Protonix 40 mg daily with Maalox as needed.  He was without chest pain by day of discharge.  If chest pain reoccurs and abdominal issues have been addressed then he should be further assessed with exercise treadmill ECG (last ETT 2004 per [facility])   #2 Abdominal discomfort: patient with constipation which may mimic inflammation on CT of abdomen. See #3. He also reported recent h/o pain radiating towards his back from the left flank area which was the reason for the CT scan of the abdomen 06/27/2011. 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 The left renal vein compression can occur when the space between the aorta and vertebra narrows.The resultant left renal vein hypertension may cause some clinical symptoms, such as hematuria, proteinuria, varicocele, flank pain/back pain, autonomic dysfunction and chronic fatigue (posterior nutcracker syndrome). US of the abdomen demonstrated tiny 3 mm polyp versus gallstone, no hydronephrosis or aortic aneurysm identified.  #3 Constipation: unclear etiology of patient's constipation. Given chronic nature this is likely due to inadequate fiber and fluid intake with poor bowel habits. He is without known endocrine dysfunction such as diabetes or thyroid issues, no signs of metabolic abnormalities given normal calcium, potassium and BUN. No  h/o neurologic or rheumatologic dysfunction. No report of chronic use of narcotics, anticholinergics or other medications for that matter. Patient with h/o prolapsing hemorrhoids s/p banding and hemorrhoidectomy. He has diverticulosis on CT of abdomen 06/27/11 but previous colonoscopy 05/2011 was without colonic stricture thus structural source of abdominal pain less likely. Results of his colonoscopy per Dr. Dulce Sellar of [facility] in 05/2011 were reviewed with [facility] on-call physician.  It was determined that questionable colitis indicated on CT was likely from prominence of stool throughout the colon giving the appearance of inflammation in light of the fact that colonoscopy was without findings of colitis. Outpatient antibiotics of Bactrim and Flagyl were discontinued. He was treated with enemas and stool softeners to stimulate bowel movements with some success.  He was discharged with aggressive bowel regimen of Colace and Miralax three times a day which should continue until daily bowel movement occurs. This regimen should be titrated per PCP.  #4 Acute Renal Insufficinecy: Although not tachycardic, Mr. Gintz appeared slightly volume depleted on exam with dry mucous membranes, slightly elevated creatinine from his baseline of 1.1 and hyponatremic with sodium of 126. He was maintained on NS IV fluids and urine electrolytes were assessed. A urine sodium less than 20 mmol/L (  Fractional Excretion of Sodium <1%) usually indicates pre-renal etiology i.e. decreased effective renal perfusion. Mr. Lovingood had a urine sodium of 37 mmol/L demonstrating fractional excretion of sodium of 1.65%. This could indicate intrinsic renal disease and should be monitored by PCP especially given patients reported history of "dark urine" in the setting of a retro-aortic left renal vein.  #5 Tobacco Abuse: Patient continues to smoke 1- 1 1/2 ppd.  He declined the nicotine patch during his stay reporting that he would prefer to go "cold  [place]".  Information for smoking cessation was provided including 1-800-QUIT-NOW for support.  SignedKristie Cowman  07/02/2011, 2:10 PM    Discharge Vitals:  BP 129/77  Pulse 70  Temp(Src) 98.6 F (37 C) (Oral)  Resp 18  SpO2 97%  Discharge Labs:  Results for orders placed during the hospital encounter of 07/02/11 (from the past 24 hour(s))  TROPONIN I     Status: Normal   Collection Time   07/02/11  1:31 PM      Component Value Range   Troponin I <0.30  <0.30 (ng/mL)  MAGNESIUM     Status: Normal   Collection Time   07/02/11  5:20 PM      Component Value Range   Magnesium 2.2  1.5 - 2.5 (mg/dL)  TSH     Status: Normal   Collection Time   07/02/11  5:20 PM      Component Value Range   TSH 1.002  0.350 - 4.500 (uIU/mL)  CARDIAC PANEL(CRET KIN+CKTOT+MB+TROPI)     Status: Normal   Collection Time   07/02/11  5:20 PM      Component Value Range   Total CK 64  7 - 232 (U/L)   CK, MB 2.3  0.3 - 4.0 (ng/mL)   Troponin I <0.30  <0.30 (ng/mL)   Relative Index RELATIVE INDEX IS INVALID  0.0 - 2.5   HEMOGLOBIN A1C     Status: Abnormal   Collection Time   07/02/11  5:20 PM      Component Value Range   Hemoglobin A1C 6.2 (*) <5.7 (%)   Mean Plasma Glucose 131 (*) <117 (mg/dL)  GAMMA GT     Status: Abnormal   Collection Time   07/02/11  5:20 PM      Component Value Range   GGT 132 (*) 7 - 51 (U/L)  LIPASE, BLOOD     Status: Normal   Collection Time   07/02/11  5:20 PM      Component Value Range   Lipase 25  11 - 59 (U/L)  URINALYSIS, ROUTINE W REFLEX MICROSCOPIC     Status: Normal   Collection Time   07/02/11  5:54 PM      Component Value Range   Color, Urine YELLOW  YELLOW    APPearance CLEAR  CLEAR    Specific Gravity, Urine 1.005  1.005 - 1.030    pH 7.0  5.0 - 8.0    Glucose, UA NEGATIVE  NEGATIVE (mg/dL)   Hgb urine dipstick NEGATIVE  NEGATIVE    Bilirubin Urine NEGATIVE  NEGATIVE    Ketones, ur NEGATIVE  NEGATIVE (mg/dL)   Protein, ur NEGATIVE  NEGATIVE (mg/dL)     Urobilinogen, UA 0.2  0.0 - 1.0 (mg/dL)   Nitrite NEGATIVE  NEGATIVE    Leukocytes, UA NEGATIVE  NEGATIVE   URINE RAPID DRUG SCREEN (HOSP PERFORMED)     Status: Abnormal   Collection Time   07/02/11  5:54 PM  Component Value Range   Opiates POSITIVE (*) NONE DETECTED    Cocaine NONE DETECTED  NONE DETECTED    Benzodiazepines NONE DETECTED  NONE DETECTED    Amphetamines NONE DETECTED  NONE DETECTED    Tetrahydrocannabinol NONE DETECTED  NONE DETECTED    Barbiturates NONE DETECTED  NONE DETECTED   SODIUM, URINE, RANDOM     Status: Normal   Collection Time   07/02/11  5:54 PM      Component Value Range   Sodium, Ur 37    CREATININE, URINE, RANDOM     Status: Normal   Collection Time   07/02/11  5:54 PM      Component Value Range   Creatinine, Urine 20.50    CARDIAC PANEL(CRET KIN+CKTOT+MB+TROPI)     Status: Normal   Collection Time   07/02/11 10:00 PM      Component Value Range   Total CK 67  7 - 232 (U/L)   CK, MB 2.1  0.3 - 4.0 (ng/mL)   Troponin I <0.30  <0.30 (ng/mL)   Relative Index RELATIVE INDEX IS INVALID  0.0 - 2.5   CARDIAC PANEL(CRET KIN+CKTOT+MB+TROPI)     Status: Normal   Collection Time   07/03/11  5:50 AM      Component Value Range   Total CK 59  7 - 232 (U/L)   CK, MB 2.1  0.3 - 4.0 (ng/mL)   Troponin I <0.30  <0.30 (ng/mL)   Relative Index RELATIVE INDEX IS INVALID  0.0 - 2.5   COMPREHENSIVE METABOLIC PANEL     Status: Abnormal   Collection Time   07/03/11  5:50 AM      Component Value Range   Sodium 131 (*) 135 - 145 (mEq/L)   Potassium 4.1  3.5 - 5.1 (mEq/L)   Chloride 101  96 - 112 (mEq/L)   CO2 23  19 - 32 (mEq/L)   Glucose, Bld 98  70 - 99 (mg/dL)   BUN 10  6 - 23 (mg/dL)   Creatinine, Ser 4.78  0.50 - 1.35 (mg/dL)   Calcium 8.9  8.4 - 29.5 (mg/dL)   Total Protein 6.1  6.0 - 8.3 (g/dL)   Albumin 3.5  3.5 - 5.2 (g/dL)   AST 42 (*) 0 - 37 (U/L)   ALT 88 (*) 0 - 53 (U/L)   Alkaline Phosphatase 80  39 - 117 (U/L)   Total Bilirubin 0.3  0.3 - 1.2  (mg/dL)   GFR calc non Af Amer 64 (*) >90 (mL/min)   GFR calc Af Amer 74 (*) >90 (mL/min)  LIPID PANEL     Status: Normal   Collection Time   07/03/11  5:50 AM      Component Value Range   Cholesterol 154  0 - 200 (mg/dL)   Triglycerides 71  <621 (mg/dL)   HDL 71  >30 (mg/dL)   Total CHOL/HDL Ratio 2.2     VLDL 14  0 - 40 (mg/dL)   LDL Cholesterol 69  0 - 99 (mg/dL)    Signed: Kristie Cowman 07/03/2011, 12:24 PM

## 2011-07-03 NOTE — Discharge Instructions (Addendum)
Chest Pain, Nonspecific Today you have had an exam and tests to determine a specific cause for your chest pain. It is often hard to give a specific diagnosis as the cause of one's chest pain. There is always a chance that your pain could be related to something serious, like a heart attack or a blood clot in the lungs. You need to follow up with your caregiver for further evaluation. More lab tests or other studies such as x-rays, an electrocardiogram, stress testing, or cardiac imaging may be needed to find the cause of your pain. Most of the time nonspecific chest pain will be improved within 2-3 days of rest and mild pain medicine. For the next few days avoid physical exertion or activities that bring on the pain. Do not smoke or drink alcohol until all your symptoms are gone. Quitting smoking is the number one way to reduce your risk for heart and lung disease. Call your caregiver for routine follow-up as advised.  SEEK IMMEDIATE MEDICAL CARE IF:  You develop increased chest pain, or pain that radiates to the arm, neck, jaw, back or abdomen.   You develop shortness of breath, increasing cough or coughing up blood.   You have severe back or abdominal pain, nausea or vomiting.   You develop severe weakness, fainting, fever or chills.  Document Released: 04/24/2005 Document Revised: 01/04/2011 Document Reviewed: 10/12/2006 [facility] Patient Information 2012 [place], [place].Constipation in Adults Constipation is having fewer than 2 bowel movements per week. Usually, the stools are hard. As we grow older, constipation is more common. If you try to fix constipation with laxatives, the problem may get worse. This is because laxatives taken over a long period of time make the colon muscles weaker. A low-fiber diet, not taking in enough fluids, and taking some medicines may make these problems worse. MEDICATIONS THAT MAY CAUSE CONSTIPATION  Water pills (diuretics).   Calcium channel blockers (used to  control blood pressure and for the heart).   Certain pain medicines (narcotics).   Anticholinergics.   Anti-inflammatory agents.   Antacids that contain aluminum.  DISEASES THAT CONTRIBUTE TO CONSTIPATION  Diabetes.   Parkinson's disease.   Dementia.   Stroke.   Depression.   Illnesses that cause problems with salt and water metabolism.  HOME CARE INSTRUCTIONS   Constipation is usually best cared for without medicines. Increasing dietary fiber and eating more fruits and vegetables is the best way to manage constipation.   Slowly increase fiber intake to 25 to 38 grams per day. Whole grains, fruits, vegetables, and legumes are good sources of fiber. A dietitian can further help you incorporate high-fiber foods into your diet.   Drink enough water and fluids to keep your urine clear or pale yellow.   A fiber supplement may be added to your diet if you cannot get enough fiber from foods.   Increasing your activities also helps improve regularity.   Suppositories, as suggested by your caregiver, will also help. If you are using antacids, such as aluminum or calcium containing products, it will be helpful to switch to products containing magnesium if your caregiver says it is okay.   If you have been given a liquid injection (enema) today, this is only a temporary measure. It should not be relied on for treatment of longstanding (chronic) constipation.   Stronger measures, such as magnesium sulfate, should be avoided if possible. This may cause uncontrollable diarrhea. Using magnesium sulfate may not allow you time to make it to the bathroom.  SEEK IMMEDIATE MEDICAL CARE IF:   There is bright red blood in the stool.   The constipation stays for more than 4 days.   There is belly (abdominal) or rectal pain.   You do not seem to be getting better.   You have any questions or concerns.  MAKE SURE YOU:   Understand these instructions.   Will watch your condition.   Will  get help right away if you are not doing well or get worse.  Document Released: 01/21/2004 Document Revised: 01/04/2011 Document Reviewed: 12/11/2007 [facility] Patient Information 2012 [place], [place].Fiber Content in Foods Drinking plenty of fluids and consuming foods high in fiber can help with constipation. See the list below for the fiber content of some common foods. Starches and Grains / Dietary Fiber (g)  Cheerios, 1 cup / 3 g   Kellogg's Corn Flakes, 1 cup / 0.7 g   Rice Krispies, 1  cup / 0.3 g   Quaker Oat Life Cereal,  cup / 2.1 g   Oatmeal, instant (cooked),  cup / 2 g   Kellogg's Frosted Mini Wheats, 1 cup / 5.1 g   Rice, brown, long-grain (cooked), 1 cup / 3.5 g   Rice, white, long-grain (cooked), 1 cup / 0.6 g   Macaroni, cooked, enriched, 1 cup / 2.5 g  Legumes / Dietary Fiber (g)  Beans, baked, canned, plain or vegetarian,  cup / 5.2 g   Beans, kidney, canned,  cup / 6.8 g   Beans, pinto, dried (cooked),  cup / 7.7 g   Beans, pinto, canned,  cup / 5.5 g  Breads and Crackers / Dietary Fiber (g)  Graham crackers, plain or honey, 2 squares / 0.7 g   Saltine crackers, 3 squares / 0.3 g   Pretzels, plain, salted, 10 pieces / 1.8 g   Bread, whole-wheat, 1 slice / 1.9 g   Bread, white, 1 slice / 0.7 g   Bread, raisin, 1 slice / 1.2 g   Bagel, plain, 3 oz / 2 g   Tortilla, flour, 1 oz / 0.9 g   Tortilla, corn, 1 small / 1.5 g   Bun, hamburger or hotdog, 1 small / 0.9 g  Fruits / Dietary Fiber (g)  Apple, raw with skin, 1 medium / 4.4 g   Applesauce, sweetened,  cup / 1.5 g   Banana,  medium / 1.5 g   Grapes, 10 grapes / 0.4 g   Orange, 1 small / 2.3 g   Raisin, 1.5 oz / 1.6 g   Melon, 1 cup / 1.4 g  Vegetables / Dietary Fiber (g)  Green beans, canned,  cup / 1.3 g   Carrots (cooked),  cup / 2.3 g   Broccoli (cooked),  cup / 2.8 g   Peas, frozen (cooked),  cup / 4.4 g   Potatoes, mashed,  cup / 1.6 g   Lettuce, 1 cup  / 0.5 g   Corn, canned,  cup / 1.6 g   Tomato,  cup / 1.1 g  Document Released: 09/10/2006 Document Revised: 01/04/2011 Document Reviewed: 11/05/2006 [facility] Patient Information 2012 [place], [place].

## 2011-07-03 NOTE — Progress Notes (Signed)
Pt D/Cd home by private vehicle. V/S stable.

## 2011-07-03 NOTE — H&P (Signed)
Internal Medicine Attending Admission Note Date: 07/03/2011  Patient name: Jacob Barr Medical record number: [id number] Date of birth: 11-Feb-1951 Age: 61 y.o. Gender: male  I saw and evaluated the patient. I reviewed the resident's note and I agree with the resident's findings and plan as documented in the resident's note.  Chief Complaint(s):  Chest and abdominal pain.  History - key components related to admission:  Jacob Barr is a 61 year old man who presents with a 24-hour history of waxing and waning chest pressure that he describes as substernal but not associated with nausea, diaphoresis, or dizziness. The pressure was constant and did not resolve. There was no radiation. In addition, he notes a significant amount of abdominal pain in the epigastric region and along the left side. This is associated with some constipation and improves with passing of gas and stool, or eating. A recent colonoscopy was unremarkable per Jacob Barr. He recently complained of this abdominal pain to his primary care provider who ordered a CT scan of the abdomen that demonstrated possible mild colitis. He was therefore placed on Bactrim and Flagyl. He is now admitted to the internal medicine teaching service for further assessment. He is a smoker but denies a history of hypertension, diabetes, hyperlipidemia, or family history of premature coronary artery disease.  Physical Exam - key components related to admission:  Filed Vitals:   07/02/11 1606 07/02/11 1634 07/02/11 2100 07/03/11 0500  BP: 113/70 135/72 120/71 129/77  Pulse: 66 77 65 70  Temp:  98.1 F (36.7 C) 98.5 F (36.9 C) 98.6 F (37 C)  TempSrc:   Oral Oral  Resp: 20 20 18 18   SpO2: 97% 98% 98% 97%   Gen.: Well-developed well-nourished man lying comfortably in bed in no acute distress. Lungs: Clear to auscultation bilaterally without wheezes, rhonchi, or rales. Heart: Regular rate and rhythm without murmurs, rubs, or gallops. Abdomen:  Soft, non tender, active bowel sounds, without masses or hepatosplenomegaly. Extremities: Without edema.  Lab results:  Basic Metabolic Panel:  Basename 07/03/11 0550 07/02/11 1720 07/02/11 1040  NA 131* -- 126*  K 4.1 -- 3.9  CL 101 -- 94*  CO2 23 -- 21  GLUCOSE 98 -- 141*  BUN 10 -- 14  CREATININE 1.20 -- 1.21  CALCIUM 8.9 -- 9.3  MG -- 2.2 --  PHOS -- -- --   Liver Function Tests:  Basename 07/03/11 0550 07/02/11 1040  AST 42* 67*  ALT 88* 125*  ALKPHOS 80 107  BILITOT 0.3 0.2*  PROT 6.1 7.4  ALBUMIN 3.5 4.1    Basename 07/02/11 1720  LIPASE 25  AMYLASE --   CBC:  Basename 07/02/11 1040  WBC 5.8  NEUTROABS 3.5  HGB 13.4  HCT 36.5*  MCV 87.1  PLT 269   Cardiac Enzymes:  Basename 07/03/11 0550 07/02/11 2200 07/02/11 1720  CKTOTAL 59 67 64  CKMB 2.1 2.1 2.3  CKMBINDEX -- -- --  TROPONINI <0.30 <0.30 <0.30   Hemoglobin A1C:  Basename 07/02/11 1720  HGBA1C 6.2*   Fasting Lipid Panel:  Basename 07/03/11 0550  CHOL 154  HDL 71  LDLCALC 69  TRIG 71  CHOLHDL 2.2  LDLDIRECT --   Thyroid Function Tests:  Basename 07/02/11 1720  TSH 1.002  T4TOTAL --  FREET4 --  T3FREE --  THYROIDAB --   Urinalysis:  Unremarkable  Imaging results:  Dg Chest 2 View  07/02/2011  *RADIOLOGY REPORT*  Clinical Data: Smoker with history of emphysema, presenting with mid chest pain.  CHEST - 2 VIEW 07/02/2011:  Comparison: Two-view chest x-ray 01/06/2007, 07/19/2002 Dr [facility].  Findings: Cardiomediastinal silhouette unremarkable, unchanged. Lungs hyperinflated with emphysematous changes diffusely, unchanged.  Lungs clear.  No pleural effusions.  Visualized bony thorax intact.  IMPRESSION: COPD/emphysema.  No acute cardiopulmonary disease.  Original Report Authenticated By: Arnell Sieving, M.D.   US Abdomen Complete  07/03/2011  *RADIOLOGY REPORT*  Clinical Data:  Abnormal LFTs, acute kidney injury  COMPLETE ABDOMINAL ULTRASOUND  Comparison:  CT  abdomen pelvis dated 06/27/2011  Findings:  Gallbladder:  Tiny 3 mm polyp versus nonshadowing gallstone.  No pericholecystic fluid or gallbladder wall thickening.  Negative sonographic Murphy's sign.  Common bile duct:  Measures 3 mm.  Liver:  No focal lesion identified.  Within normal limits in parenchymal echogenicity.  IVC:  Appears normal.  Pancreas:  Incompletely visualized but grossly unremarkable.  Spleen:  Measures 5.9 cm.  Right Kidney:  Measures 9.8 cm.  No mass or hydronephrosis.  Left Kidney:  Measures 9.9 cm.  No mass or hydronephrosis.  Abdominal aorta:  No aneurysm identified.  Atherosclerosis.  IMPRESSION: 3 mm polyp versus nonshadowing gallstone.  No associated findings to suggest acute cholecystitis.  No hydronephrosis.  Original Report Authenticated By: Charline Bills, M.D.   Other results:  EKG: normal sinus rhythm at 75 beats per minute, normal intervals, normal axis, no Q waves or LVH, no ST-T changes, unchanged from the previous ECG on 01/06/2007.  Assessment & Plan by Problem:  Jacob Barr is a 61 year old man with a history of tobacco abuse who presents with a one day history of continuous chest pressure which is atypical in its nature as well as its associated symptoms. Specifically he had no other associated symptoms. Of more import is his several week history of abdominal pain mainly in the epigastric area and  radiating down the left abdomen. For this he has had a colonoscopy which was unremarkable and a subsequent CT scan which showed significant stool in the colon that was likely misinterpreted as colitis of the colonic wall.  1) Chest pain: Jacob Barr is ruled out for myocardial infarction with serial EKGs and enzymes. He is currently without any chest pain. His pain was most likely of abdominal origin. We will be addressing that in #2 below. In the meantime, if we address his abdominal problems to satisfaction and he has recurrent pain he would be a candidate for further  evaluation with an exercise treadmill ECG.  2) Abdominal pain: Jacob Barr has had a recent colonoscopy which was unremarkable and a CT scan which demonstrated significant amount of stool and what was interpreted as possible colitis. It is very likely that his abdominal pain is from this significant constipation. Although he is not on any narcotics and has no bowel motility disorder it is clear that his bowel regimen is less than ideal. Our goal is to stimulate bowel movements via stool softeners as well as enemas. We will discharge him home on a regimen that can be titrated as an outpatient. If this resolves his symptoms there is a great chance this was the cause that resulted in his admission. If his symptoms do not resolve or he develops chest pain further evaluation will be necessary.  3) Disposition: I agree with housestaff's plan to discharge Mr. Favorite home today with a new bowel regimen and follow up with his primary care provider.

## 2011-08-12 ENCOUNTER — Emergency Department (HOSPITAL_COMMUNITY)
Admission: EM | Admit: 2011-08-12 | Discharge: 2011-08-12 | Disposition: A | Discharge status: Home / Self Care | Payer: BC Managed Care – PPO | Attending: Emergency Medicine | Admitting: Emergency Medicine

## 2011-08-12 ENCOUNTER — Other Ambulatory Visit: Payer: Self-pay

## 2011-08-12 ENCOUNTER — Encounter (HOSPITAL_COMMUNITY): Payer: Self-pay | Admitting: *Deleted

## 2011-08-12 ENCOUNTER — Emergency Department (HOSPITAL_COMMUNITY): Payer: BC Managed Care – PPO

## 2011-08-12 DIAGNOSIS — R0602 Shortness of breath: Secondary | ICD-10-CM

## 2011-08-12 DIAGNOSIS — J438 Other emphysema: Secondary | ICD-10-CM | POA: Insufficient documentation

## 2011-08-12 DIAGNOSIS — F17200 Nicotine dependence, unspecified, uncomplicated: Secondary | ICD-10-CM | POA: Insufficient documentation

## 2011-08-12 LAB — BASIC METABOLIC PANEL
BUN: 13 mg/dL (ref 6–23)
CO2: 24 mEq/L (ref 19–32)
Calcium: 9.3 mg/dL (ref 8.4–10.5)
Chloride: 97 mEq/L (ref 96–112)
Creatinine, Ser: 1.11 mg/dL (ref 0.50–1.35)
GFR calc Af Amer: 82 mL/min — ABNORMAL LOW (ref >90)
GFR calc non Af Amer: 70 mL/min — ABNORMAL LOW (ref >90)
Glucose, Bld: 102 mg/dL — ABNORMAL HIGH (ref 70–99)
Potassium: 3.9 mEq/L (ref 3.5–5.1)
Sodium: 131 mEq/L — ABNORMAL LOW (ref 135–145)

## 2011-08-12 LAB — CBC
HCT: 34.4 % — ABNORMAL LOW (ref 39.0–52.0)
Hemoglobin: 12.4 g/dL — ABNORMAL LOW (ref 13.0–17.0)
MCH: 32.3 pg (ref 26.0–34.0)
MCHC: 36 g/dL (ref 30.0–36.0)
MCV: 89.6 fL (ref 78.0–100.0)
Platelets: 249 10*3/uL (ref 150–400)
RBC: 3.84 MIL/uL — ABNORMAL LOW (ref 4.22–5.81)
RDW: 12.8 % (ref 11.5–15.5)
WBC: 7.7 10*3/uL (ref 4.0–10.5)

## 2011-08-12 LAB — POCT I-STAT TROPONIN I: Troponin i, poc: 0.01 ng/mL (ref 0.00–0.08)

## 2011-08-12 LAB — PRO B NATRIURETIC PEPTIDE: Pro B Natriuretic peptide (BNP): 83.8 pg/mL (ref 0–125)

## 2011-08-12 MED ORDER — ALBUTEROL SULFATE HFA 108 (90 BASE) MCG/ACT IN AERS
2.0000 | INHALATION_SPRAY | RESPIRATORY_TRACT | Status: DC | PRN
  Administered 2011-08-12: 2 via RESPIRATORY_TRACT
  Filled 2011-08-12: qty 6.7

## 2011-08-12 MED ORDER — AEROCHAMBER PLUS W/MASK MISC
Status: AC
  Administered 2011-08-12: 06:00:00
  Filled 2011-08-12: qty 1

## 2011-08-12 NOTE — ED Notes (Signed)
Pt given Coke to drink per Dr.Linker

## 2011-08-12 NOTE — ED Provider Notes (Signed)
History     CSN: [id number]  Arrival date & time 08/12/11  0043   First MD Initiated Contact with Patient 08/12/11 0315      Chief Complaint  Patient presents with  . Shortness of Breath    (Consider location/radiation/quality/duration/timing/severity/associated sxs/prior treatment) HPI Pt presents with c/o shortness of breath.  Pt states he had a brief episode of feeling short of breath when lying down to go to sleep tonight.  He states that when he gets up and walks around he feels improved.  He denies chest pain, no leg swelling, No fever or cough.  He has a hx of COPD but states he does not use inhalers or any other treatments.  Pt states symptoms have completely resolved upon arrival to the ED and lasted several seconds.  Resolved without any treatment  Past Medical History  Diagnosis Date  . Emphysema   . Tobacco abuse   . COPD (chronic obstructive pulmonary disease)     Past Surgical History  Procedure Date  . Hemorrhoid surgery   . Hemorrhoid surgery 2008    History reviewed. No pertinent family history.  History  Substance Use Topics  . Smoking status: Current Everyday Smoker -- 1.5 packs/day for 50 years  . Smokeless tobacco: Not on file  . Alcohol Use: No      Review of Systems ROS reviewed and all otherwise negative except for mentioned in HPI  Allergies  Review of patient's allergies indicates no known allergies.  Home Medications   Current Outpatient Rx  Name Route Sig Dispense Refill  . ASPIRIN EC 81 MG PO TBEC Oral Take 81 mg by mouth daily.    Marland Kitchen EQL FIBER SUPPLEMENT PO Oral Take 1 tablet by mouth daily.    Marland Kitchen DOCUSATE SODIUM 100 MG PO CAPS Oral Take 100 mg by mouth 2 (two) times daily. Stool softener    . HYOSCYAMINE SULFATE ER 0.375 MG PO TB12 Oral Take 0.375 mg by mouth every 12 (twelve) hours as needed. Stomach spasms    . POLYETHYLENE GLYCOL 3350 PO PACK Oral Take 17 g by mouth daily as needed. For constipation      BP 121/80  Pulse 58   Temp(Src) 97.6 F (36.4 C) (Oral)  Resp 18  SpO2 97% Vitals reviewed Physical Exam Physical Examination: General appearance - alert, well appearing, and in no distress Mental status - alert, oriented to person, place, and time Eyes - pupils equal and reactive, no conjunctival injection or scleral icterus Mouth - mucous membranes moist, pharynx normal without lesions Chest - clear to auscultation, no wheezes, rales or rhonchi, symmetric air entry, no increased respiratory effort.  Heart - normal rate, regular rhythm, normal S1, S2, no murmurs, rubs, clicks or gallops Abdomen - soft, nontender, nondistended, no masses or organomegaly, nabs Extremities - peripheral pulses normal, no pedal edema, no clubbing or cyanosis Skin - normal coloration and turgor, no rashes  ED Course  Procedures (including critical care time)   Date: 08/12/2011  Rate: 53  Rhythm: normal sinus rhythm  QRS Axis: normal  Intervals: normal  ST/T Wave abnormalities: normal  Conduction Disutrbances: none  Narrative Interpretation: unremarkable, no significant change compared to prior ekg of 07/03/11     Labs Reviewed  CBC - Abnormal; Notable for the following:    RBC 3.84 (*)    Hemoglobin 12.4 (*)    HCT 34.4 (*)    All other components within normal limits  BASIC METABOLIC PANEL - Abnormal; Notable for the  following:    Sodium 131 (*)    Glucose, Bld 102 (*)    GFR calc non Af Amer 70 (*)    GFR calc Af Amer 82 (*)    All other components within normal limits  PRO B NATRIURETIC PEPTIDE  POCT I-STAT TROPONIN I  LAB REPORT - SCANNED   No results found.   1. Shortness of breath       MDM  Pt presents with resolved isolated episode of shortness of breath when lying down tonight.  Resolved after getting up and walking around.  Pt has normal examination, no wheezing or increased respiratory effort.  Workup tonight reveals no acute abnormalities.  I have had a long discussion with patient and family  at bedside- all questions answered to the best of my ability.  Pt was given albuterol inhaler as this may be due to mild COPD.  Discharged with strict return precautions.  Pt agreeable with plan.        Ethelda Chick, MD 08/16/11 [phone]

## 2011-08-12 NOTE — ED Notes (Signed)
Pt states that he went to sleep feeling fine then he woke up with a sudden onset of SOB. Pt does not appear in respiratory distress, but states that he feels different and is more confused and disoriented mid sentence pt will trail off in thought instead of finishing thought. Pt wife states that he was out doing lawn work with son all day and had no problems until he laid down. Pt breath sounds clear. Pt feels like he would not be able to do normal activity. Pt denies pain.

## 2011-08-12 NOTE — ED Notes (Addendum)
Pt reports laying down to go to sleep and getting SOB.  Pt speaking in complete sentences, no respiratory distress noted.  Ambulatory.  BS clear bilaterally.  Denies CP.

## 2011-08-12 NOTE — Discharge Instructions (Signed)
Return to the ED with any concerns including chest pain, difficulty breathing, leg swelling, palpitations, fainting, decreased level of alertness/lethargy, or any other alarming symptoms  You can use the albuterol inhaler 2 puffs every 4 hours as needed for cough or shortness of breath

## 2011-08-17 ENCOUNTER — Encounter (HOSPITAL_COMMUNITY): Payer: Self-pay | Admitting: *Deleted

## 2011-08-17 ENCOUNTER — Emergency Department (HOSPITAL_COMMUNITY): Payer: BC Managed Care – PPO

## 2011-08-17 ENCOUNTER — Emergency Department (HOSPITAL_COMMUNITY)
Admission: EM | Admit: 2011-08-17 | Discharge: 2011-08-18 | Disposition: A | Discharge status: Home / Self Care | Payer: BC Managed Care – PPO | Attending: Emergency Medicine | Admitting: Emergency Medicine

## 2011-08-17 DIAGNOSIS — R109 Unspecified abdominal pain: Secondary | ICD-10-CM | POA: Insufficient documentation

## 2011-08-17 DIAGNOSIS — E871 Hypo-osmolality and hyponatremia: Secondary | ICD-10-CM | POA: Insufficient documentation

## 2011-08-17 DIAGNOSIS — J438 Other emphysema: Secondary | ICD-10-CM | POA: Insufficient documentation

## 2011-08-17 DIAGNOSIS — F1010 Alcohol abuse, uncomplicated: Secondary | ICD-10-CM | POA: Insufficient documentation

## 2011-08-17 LAB — DIFFERENTIAL
Basophils Absolute: 0 10*3/uL (ref 0.0–0.1)
Basophils Relative: 0 % (ref 0–1)
Eosinophils Absolute: 0 10*3/uL (ref 0.0–0.7)
Eosinophils Relative: 0 % (ref 0–5)
Lymphocytes Relative: 36 % (ref 12–46)
Lymphs Abs: 2.7 10*3/uL (ref 0.7–4.0)
Monocytes Absolute: 0.7 10*3/uL (ref 0.1–1.0)
Monocytes Relative: 9 % (ref 3–12)
Neutro Abs: 4.2 10*3/uL (ref 1.7–7.7)
Neutrophils Relative %: 55 % (ref 43–77)

## 2011-08-17 LAB — COMPREHENSIVE METABOLIC PANEL
ALT: 32 U/L (ref 0–53)
AST: 27 U/L (ref 0–37)
Albumin: 4 g/dL (ref 3.5–5.2)
Alkaline Phosphatase: 85 U/L (ref 39–117)
BUN: 11 mg/dL (ref 6–23)
CO2: 21 mEq/L (ref 19–32)
Calcium: 9 mg/dL (ref 8.4–10.5)
Chloride: 90 mEq/L — ABNORMAL LOW (ref 96–112)
Creatinine, Ser: 1 mg/dL (ref 0.50–1.35)
GFR calc Af Amer: >90 mL/min (ref >90)
GFR calc non Af Amer: 80 mL/min — ABNORMAL LOW (ref >90)
Glucose, Bld: 98 mg/dL (ref 70–99)
Potassium: 3.8 mEq/L (ref 3.5–5.1)
Sodium: 122 mEq/L — ABNORMAL LOW (ref 135–145)
Total Bilirubin: 0.7 mg/dL (ref 0.3–1.2)
Total Protein: 6.4 g/dL (ref 6.0–8.3)

## 2011-08-17 LAB — CBC
HCT: 33.2 % — ABNORMAL LOW (ref 39.0–52.0)
Hemoglobin: 12.2 g/dL — ABNORMAL LOW (ref 13.0–17.0)
MCH: 32.8 pg (ref 26.0–34.0)
MCHC: 36.7 g/dL — ABNORMAL HIGH (ref 30.0–36.0)
MCV: 89.2 fL (ref 78.0–100.0)
Platelets: 242 10*3/uL (ref 150–400)
RBC: 3.72 MIL/uL — ABNORMAL LOW (ref 4.22–5.81)
RDW: 12.5 % (ref 11.5–15.5)
WBC: 7.7 10*3/uL (ref 4.0–10.5)

## 2011-08-17 LAB — LIPASE, BLOOD: Lipase: 16 U/L (ref 11–59)

## 2011-08-17 MED ORDER — SODIUM CHLORIDE 0.9 % IV BOLUS (SEPSIS)
500.0000 mL | Freq: Once | INTRAVENOUS | Status: AC
  Administered 2011-08-17: 23:00:00 via INTRAVENOUS

## 2011-08-17 MED ORDER — MORPHINE SULFATE 4 MG/ML IJ SOLN
4.0000 mg | Freq: Once | INTRAMUSCULAR | Status: AC
  Administered 2011-08-17: 4 mg via INTRAVENOUS
  Filled 2011-08-17: qty 1

## 2011-08-17 MED ORDER — ONDANSETRON HCL 4 MG/2ML IJ SOLN
4.0000 mg | Freq: Once | INTRAMUSCULAR | Status: AC
  Administered 2011-08-17: 4 mg via INTRAVENOUS
  Filled 2011-08-17: qty 2

## 2011-08-17 NOTE — ED Notes (Signed)
Patient states that he has left sided flank pain that started yesterday.  Pt states that he was seen in ED for same last week.  Pt states that he hasn't been able to sleep.

## 2011-08-17 NOTE — ED Provider Notes (Signed)
History     CSN: [id number]  Arrival date & time 08/17/11  1478   First MD Initiated Contact with Patient 08/17/11 2208      Chief Complaint  Patient presents with  . Flank Pain     HPI  History provided by the patient and spouse. Patient is a 61 year old male with history of COPD emphysema and tobacco abuse who presents with complaints of persistent left-sided abdominal pains. Patient reports having similar symptoms for the past several months. He reports previous evaluations in emergency room for similar complaints. He also reports hospitalization at one time. Patient has seen GI specialist, Dr. Dulce Sellar and has had colonoscopies performed. Symptoms were possibly thought to be related to the spasms of the intestines. Patient has been taking anti-spasmodics without relief. Patient has also been using some stool softeners to help with constipation issues. Patient does report having daily stools but has been straining still recently. He denies any abdominal distention, nausea, vomiting, fever, chills, sweats. Patient denies any chest pain, shortness of breath or palpitations. Patient has no significant abdominal surgery history. Symptoms are described as severe. He denies any other aggravating or alleviating factors.    Past Medical History  Diagnosis Date  . Emphysema   . Tobacco abuse   . COPD (chronic obstructive pulmonary disease)     Past Surgical History  Procedure Date  . Hemorrhoid surgery   . Hemorrhoid surgery 2008    History reviewed. No pertinent family history.  History  Substance Use Topics  . Smoking status: Current Everyday Smoker -- 1.5 packs/day for 50 years  . Smokeless tobacco: Not on file  . Alcohol Use: No      Review of Systems  Constitutional: Negative for fever, chills and appetite change.  Respiratory: Negative for cough and shortness of breath.   Cardiovascular: Negative for chest pain.  Gastrointestinal: Positive for abdominal pain. Negative  for nausea, vomiting, diarrhea, constipation, blood in stool and anal bleeding.  Genitourinary: Negative for dysuria, frequency, hematuria and flank pain.  Skin: Negative for rash.    Allergies  Review of patient's allergies indicates no known allergies.  Home Medications   Current Outpatient Rx  Name Route Sig Dispense Refill  . ASPIRIN EC 81 MG PO TBEC Oral Take 81 mg by mouth daily.    Marland Kitchen EQL FIBER SUPPLEMENT PO Oral Take 1 tablet by mouth daily.    Marland Kitchen DOCUSATE SODIUM 100 MG PO CAPS Oral Take 100 mg by mouth 2 (two) times daily. Stool softener    . HYOSCYAMINE SULFATE ER 0.375 MG PO TB12 Oral Take 0.375 mg by mouth every 12 (twelve) hours as needed. Stomach spasms    . POLYETHYLENE GLYCOL 3350 PO PACK Oral Take 17 g by mouth daily as needed. For constipation      BP 131/87  Pulse 70  Temp(Src) 97.7 F (36.5 C) (Oral)  Resp 20  SpO2 99%  Physical Exam  Nursing note and vitals reviewed. Constitutional: He is oriented to person, place, and time. He appears well-developed and well-nourished. No distress.  HENT:  Head: Normocephalic and atraumatic.  Neck: Normal range of motion. Neck supple.       No meningeal sign  Cardiovascular: Normal rate and regular rhythm.   Pulmonary/Chest: Effort normal and breath sounds normal. No respiratory distress. He has no wheezes. He has no rales.  Abdominal: Soft. There is tenderness in the left lower quadrant. There is no rebound, no guarding, no CVA tenderness, no tenderness at McBurney's point and  negative Murphy's sign.  Neurological: He is alert and oriented to person, place, and time.  Skin: Skin is warm. No rash noted. No erythema.  Psychiatric: He has a normal mood and affect. His behavior is normal.    ED Course  Procedures   Results for orders placed during the hospital encounter of 08/17/11  CBC      Component Value Range   WBC 7.7  4.0 - 10.5 (K/uL)   RBC 3.72 (*) 4.22 - 5.81 (MIL/uL)   Hemoglobin 12.2 (*) 13.0 - 17.0 (g/dL)     HCT 16.1 (*) 09.6 - 52.0 (%)   MCV 89.2  78.0 - 100.0 (fL)   MCH 32.8  26.0 - 34.0 (pg)   MCHC 36.7 (*) 30.0 - 36.0 (g/dL)   RDW 04.5  40.9 - 81.1 (%)   Platelets 242  150 - 400 (K/uL)  DIFFERENTIAL      Component Value Range   Neutrophils Relative 55  43 - 77 (%)   Neutro Abs 4.2  1.7 - 7.7 (K/uL)   Lymphocytes Relative 36  12 - 46 (%)   Lymphs Abs 2.7  0.7 - 4.0 (K/uL)   Monocytes Relative 9  3 - 12 (%)   Monocytes Absolute 0.7  0.1 - 1.0 (K/uL)   Eosinophils Relative 0  0 - 5 (%)   Eosinophils Absolute 0.0  0.0 - 0.7 (K/uL)   Basophils Relative 0  0 - 1 (%)   Basophils Absolute 0.0  0.0 - 0.1 (K/uL)  COMPREHENSIVE METABOLIC PANEL      Component Value Range   Sodium 122 (*) 135 - 145 (mEq/L)   Potassium 3.8  3.5 - 5.1 (mEq/L)   Chloride 90 (*) 96 - 112 (mEq/L)   CO2 21  19 - 32 (mEq/L)   Glucose, Bld 98  70 - 99 (mg/dL)   BUN 11  6 - 23 (mg/dL)   Creatinine, Ser 9.14  0.50 - 1.35 (mg/dL)   Calcium 9.0  8.4 - 78.2 (mg/dL)   Total Protein 6.4  6.0 - 8.3 (g/dL)   Albumin 4.0  3.5 - 5.2 (g/dL)   AST 27  0 - 37 (U/L)   ALT 32  0 - 53 (U/L)   Alkaline Phosphatase 85  39 - 117 (U/L)   Total Bilirubin 0.7  0.3 - 1.2 (mg/dL)   GFR calc non Af Amer 80 (*) >90 (mL/min)   GFR calc Af Amer >90  >90 (mL/min)  LIPASE, BLOOD      Component Value Range   Lipase 16  11 - 59 (U/L)      Dg Abd Acute W/chest  08/17/2011  *RADIOLOGY REPORT*  Clinical Data: Left lower quadrant abdominal pain for several months.  ACUTE ABDOMEN SERIES (ABDOMEN 2 VIEW & CHEST 1 VIEW)  Comparison: Chest radiograph performed 08/12/2011, CT of the abdomen and pelvis performed 06/27/2011, and abdominal ultrasound performed 07/03/2011  Findings: The lungs are hyperexpanded, with flattening of the hemidiaphragms, likely reflecting COPD.  There is no evidence of focal opacification, pleural effusion or pneumothorax.  The cardiomediastinal silhouette is within normal limits.  The visualized bowel gas pattern is  unremarkable.  Scattered stool and air are seen within the colon; there is no evidence of small bowel dilatation to suggest obstruction.  No free intra-abdominal air is identified on the provided upright view.  Postoperative change is noted at the lower pelvis.  No acute osseous abnormalities are seen; the sacroiliac joints are unremarkable in appearance.  IMPRESSION:  1.  Unremarkable bowel gas pattern; no free intra-abdominal air seen. 2.  No acute cardiopulmonary process identified.  Underlying COPD noted.  Original Report Authenticated By: Tonia Ghent, M.D.     1. Abdominal pain   2. Hyponatremia       MDM  10:28 PM patient seen and evaluated. Patient in no acute distress.   Patient discussed with attending physician. he does have improvement pains. This time we'll discharge home with close PCP followup. Patient to have recheck of sodium next week by PCP. Will also advise patient to restrain from free water intake.     Angus Seller, [place] 08/18/11 [phone]

## 2011-08-18 MED ORDER — HYDROCODONE-ACETAMINOPHEN 5-325 MG PO TABS: 1.0000 | ORAL_TABLET | ORAL | Status: AC | PRN

## 2011-08-18 MED ORDER — MORPHINE SULFATE 4 MG/ML IJ SOLN
4.0000 mg | Freq: Once | INTRAMUSCULAR | Status: AC
  Administered 2011-08-18: 4 mg via INTRAVENOUS
  Filled 2011-08-18: qty 1

## 2011-08-18 MED ORDER — SODIUM CHLORIDE 0.9 % IV BOLUS (SEPSIS)
500.0000 mL | Freq: Once | INTRAVENOUS | Status: AC
  Administered 2011-08-18: 1000 mL via INTRAVENOUS

## 2011-08-18 MED ORDER — DOCUSATE SODIUM 100 MG PO CAPS: 100.0000 mg | ORAL_CAPSULE | Freq: Three times a day (TID) | ORAL | Status: AC | PRN

## 2011-08-18 NOTE — Discharge Instructions (Signed)
Your lab tests and x-ray today have not shown any signs for concerning or emergent cause her symptoms. Your labs do show that your sodium level was low. Your given IV fluids to help replenish your sodium levels. It is recommended that you avoid drinking plain water for the next several days and followup with your primary care provider next week to have a recheck of your sodium level. Continue to use Colace to keep your stools soft.  Abdominal Pain Abdominal pain can be caused by many things. Your caregiver decides the seriousness of your pain by an examination and possibly blood tests and X-rays. Many cases can be observed and treated at home. Most abdominal pain is not caused by a disease and will probably improve without treatment. However, in many cases, more time must pass before a clear cause of the pain can be found. Before that point, it may not be known if you need more testing, or if hospitalization or surgery is needed. HOME CARE INSTRUCTIONS   Do not take laxatives unless directed by your caregiver.   Take pain medicine only as directed by your caregiver.   Only take over-the-counter or prescription medicines for pain, discomfort, or fever as directed by your caregiver.   Try a clear liquid diet (broth, tea, or water) for as long as directed by your caregiver. Slowly move to a bland diet as tolerated.  SEEK IMMEDIATE MEDICAL CARE IF:   The pain does not go away.   You have a fever.   You keep throwing up (vomiting).   The pain is felt only in portions of the abdomen. Pain in the right side could possibly be appendicitis. In an adult, pain in the left lower portion of the abdomen could be colitis or diverticulitis.   You pass bloody or black tarry stools.  MAKE SURE YOU:   Understand these instructions.   Will watch your condition.   Will get help right away if you are not doing well or get worse.  Document Released: 02/01/2005 Document Revised: 04/13/2011 Document Reviewed:  12/11/2007 [facility] Patient Information 2012 [place], [place].    Hyponatremia  Hyponatremia is when the amount of salt (sodium) in your blood is too low. When sodium levels are low, your cells will absorb extra water and swell. The swelling happens throughout the body, but it mostly affects the brain. Severe brain swelling (cerebral edema), seizures, or coma can happen.  CAUSES   Heart, kidney, or liver problems.   Thyroid problems.   Adrenal gland problems.   Severe vomiting and diarrhea.   Certain medicines or illegal drugs.   Dehydration.   Drinking too much water.   Low-sodium diet.  SYMPTOMS   Nausea and vomiting.   Confusion.   Lethargy.   Agitation.   Headache.   Twitching or shaking (seizures).   Unconsciousness.   Appetite loss.   Muscle weakness and cramping.  DIAGNOSIS  Hyponatremia is identified by a simple blood test. Your caregiver will perform a history and physical exam to try to find the cause and type of hyponatremia. Other tests may be needed to measure the amount of sodium in your blood and urine. TREATMENT  Treatment will depend on the cause.   Fluids may be given through the vein (IV).   Medicines may be used to correct the sodium imbalance. If medicines are causing the problem, they will need to be adjusted.   Water or fluid intake may be restricted to restore proper balance.  The speed of  correcting the sodium problem is very important. If the problem is corrected too fast, nerve damage (sometimes unchangeable) can happen. HOME CARE INSTRUCTIONS   Only take medicines as directed by your caregiver. Many medicines can make hyponatremia worse. Discuss all your medicines with your caregiver.   Carefully follow any recommended diet, including any fluid restrictions.   You may be asked to repeat lab tests. Follow these directions.   Avoid alcohol and recreational drugs.  SEEK MEDICAL CARE IF:   You develop worsening nausea, fatigue,  headache, confusion, or weakness.   Your original hyponatremia symptoms return.   You have problems following the recommended diet.  SEEK IMMEDIATE MEDICAL CARE IF:   You have a seizure.   You faint.   You have ongoing diarrhea or vomiting.  MAKE SURE YOU:   Understand these instructions.   Will watch your condition.   Will get help right away if you are not doing well or get worse.  Document Released: 04/14/2002 Document Revised: 04/13/2011 Document Reviewed: 10/09/2010 [facility] Patient Information 2012 [place], [place].

## 2011-08-22 NOTE — ED Provider Notes (Signed)
Medical screening examination/treatment/procedure(s) were performed by non-physician practitioner and as supervising physician I was immediately available for consultation/collaboration.   Celene Kras, MD 08/22/11 [phone]

## 2011-08-23 ENCOUNTER — Emergency Department (HOSPITAL_COMMUNITY)
Admission: EM | Admit: 2011-08-23 | Discharge: 2011-08-24 | Disposition: A | Discharge status: Home / Self Care | Payer: BC Managed Care – PPO | Attending: Emergency Medicine | Admitting: Emergency Medicine

## 2011-08-23 ENCOUNTER — Emergency Department (HOSPITAL_COMMUNITY): Payer: BC Managed Care – PPO

## 2011-08-23 ENCOUNTER — Encounter (HOSPITAL_COMMUNITY): Payer: Self-pay | Admitting: Cardiology

## 2011-08-23 DIAGNOSIS — F29 Unspecified psychosis not due to a substance or known physiological condition: Secondary | ICD-10-CM | POA: Insufficient documentation

## 2011-08-23 DIAGNOSIS — R4182 Altered mental status, unspecified: Secondary | ICD-10-CM | POA: Insufficient documentation

## 2011-08-23 DIAGNOSIS — Z79899 Other long term (current) drug therapy: Secondary | ICD-10-CM | POA: Insufficient documentation

## 2011-08-23 DIAGNOSIS — J4489 Other specified chronic obstructive pulmonary disease: Secondary | ICD-10-CM | POA: Insufficient documentation

## 2011-08-23 DIAGNOSIS — R410 Disorientation, unspecified: Secondary | ICD-10-CM

## 2011-08-23 DIAGNOSIS — Z7982 Long term (current) use of aspirin: Secondary | ICD-10-CM | POA: Insufficient documentation

## 2011-08-23 DIAGNOSIS — R1012 Left upper quadrant pain: Secondary | ICD-10-CM | POA: Insufficient documentation

## 2011-08-23 DIAGNOSIS — J449 Chronic obstructive pulmonary disease, unspecified: Secondary | ICD-10-CM | POA: Insufficient documentation

## 2011-08-23 LAB — DIFFERENTIAL
Basophils Absolute: 0 10*3/uL (ref 0.0–0.1)
Basophils Relative: 0 % (ref 0–1)
Eosinophils Absolute: 0 10*3/uL (ref 0.0–0.7)
Eosinophils Relative: 0 % (ref 0–5)
Lymphocytes Relative: 30 % (ref 12–46)
Lymphs Abs: 2.2 10*3/uL (ref 0.7–4.0)
Monocytes Absolute: 0.5 10*3/uL (ref 0.1–1.0)
Monocytes Relative: 7 % (ref 3–12)
Neutro Abs: 4.6 10*3/uL (ref 1.7–7.7)
Neutrophils Relative %: 62 % (ref 43–77)

## 2011-08-23 LAB — COMPREHENSIVE METABOLIC PANEL
ALT: 19 U/L (ref 0–53)
AST: 18 U/L (ref 0–37)
Albumin: 4.4 g/dL (ref 3.5–5.2)
Alkaline Phosphatase: 85 U/L (ref 39–117)
BUN: 13 mg/dL (ref 6–23)
CO2: 21 mEq/L (ref 19–32)
Calcium: 9.8 mg/dL (ref 8.4–10.5)
Chloride: 91 mEq/L — ABNORMAL LOW (ref 96–112)
Creatinine, Ser: 1.12 mg/dL (ref 0.50–1.35)
GFR calc Af Amer: 81 mL/min — ABNORMAL LOW (ref >90)
GFR calc non Af Amer: 70 mL/min — ABNORMAL LOW (ref >90)
Glucose, Bld: 114 mg/dL — ABNORMAL HIGH (ref 70–99)
Potassium: 3.8 mEq/L (ref 3.5–5.1)
Sodium: 126 mEq/L — ABNORMAL LOW (ref 135–145)
Total Bilirubin: 0.6 mg/dL (ref 0.3–1.2)
Total Protein: 7.2 g/dL (ref 6.0–8.3)

## 2011-08-23 LAB — RAPID URINE DRUG SCREEN, HOSP PERFORMED
Amphetamines: NOT DETECTED
Barbiturates: NOT DETECTED
Benzodiazepines: NOT DETECTED
Cocaine: NOT DETECTED
Opiates: NOT DETECTED
Tetrahydrocannabinol: NOT DETECTED

## 2011-08-23 LAB — CBC
HCT: 35.7 % — ABNORMAL LOW (ref 39.0–52.0)
Hemoglobin: 13.1 g/dL (ref 13.0–17.0)
MCH: 32.6 pg (ref 26.0–34.0)
MCHC: 36.7 g/dL — ABNORMAL HIGH (ref 30.0–36.0)
MCV: 88.8 fL (ref 78.0–100.0)
Platelets: 290 10*3/uL (ref 150–400)
RBC: 4.02 MIL/uL — ABNORMAL LOW (ref 4.22–5.81)
RDW: 12.4 % (ref 11.5–15.5)
WBC: 7.3 10*3/uL (ref 4.0–10.5)

## 2011-08-23 LAB — URINALYSIS, ROUTINE W REFLEX MICROSCOPIC
Bilirubin Urine: NEGATIVE
Glucose, UA: NEGATIVE mg/dL
Hgb urine dipstick: NEGATIVE
Ketones, ur: NEGATIVE mg/dL
Leukocytes, UA: NEGATIVE
Nitrite: NEGATIVE
Protein, ur: NEGATIVE mg/dL
Specific Gravity, Urine: 1.009 (ref 1.005–1.030)
Urobilinogen, UA: 0.2 mg/dL (ref 0.0–1.0)
pH: 7.5 (ref 5.0–8.0)

## 2011-08-23 LAB — ETHANOL: Alcohol, Ethyl (B): <11 mg/dL (ref 0–11)

## 2011-08-23 LAB — AMMONIA: Ammonia: <10 umol/L — ABNORMAL LOW (ref 11–60)

## 2011-08-23 MED ORDER — SODIUM CHLORIDE 0.9 % IV BOLUS (SEPSIS)
500.0000 mL | Freq: Once | INTRAVENOUS | Status: AC
  Administered 2011-08-24: 500 mL via INTRAVENOUS

## 2011-08-23 NOTE — BH Assessment (Signed)
Assessment Note   Jacob Barr is an 61 y.o. male brought in by his family for confusion.  The patient is almost completely non responsive during assessment.  When pressed, he will give very quiet yes or no answers.  At times, he appears to require significant thought to answer simple questions such as, when did you retire or what did you do prior to retiring.  Pt did not answer when asked whether he had thoughts of killing himself, but did appear to acknowledge the question.  His son and wife were present for later questioning and provided collateral information.  They report that since August, after his retirement, he has had difficulty sleeping and reports being very bored.  He has also been to the hospital almost weekly complaining of pain that has no explanation.  Today, he continued to complain of stomach pain, but also began to display symptoms of psychosis including disorientation, confusion, elective mutism, and catatonia.    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 Emphysema   . Tobacco abuse   . COPD (chronic obstructive pulmonary disease)    Axis IV: occupational problems Axis V: 11-20 some danger of hurting self or others possible OR occasionally fails to maintain minimal personal hygiene OR gross impairment in communication    Past Medical History:  Past Medical History  Diagnosis Date  . Emphysema   . Tobacco abuse   . COPD (chronic obstructive pulmonary disease)     Past Surgical History  Procedure Date  . Hemorrhoid surgery   . Hemorrhoid surgery 2008    Family History: History reviewed. No pertinent family history.  Social History:  reports that he has been smoking.  He does not have any smokeless tobacco history on file. He reports that he does not drink alcohol or use illicit drugs.  Additional Social History:  Alcohol / Drug Use History of alcohol / drug use?: No  history of alcohol / drug abuse Allergies: No Known Allergies  Home Medications:  No current facility-administered medications on file as of 08/23/2011.   Medications Prior to Admission  Medication Sig Dispense Refill  . aspirin EC 81 MG tablet Take 81 mg by mouth daily.      Marland Kitchen Corn Dextrin (EQL FIBER SUPPLEMENT PO) Take 1 tablet by mouth daily.      Marland Kitchen docusate sodium (COLACE) 100 MG capsule Take 1 capsule (100 mg total) by mouth 3 (three) times daily as needed for constipation.  60 capsule  0  . HYDROcodone-acetaminophen (NORCO) 5-325 MG per tablet Take 1-2 tablets by mouth every 4 (four) hours as needed for pain.  20 tablet  0  . hyoscyamine (LEVBID) 0.375 MG 12 hr tablet Take 0.375 mg by mouth every 12 (twelve) hours as needed. Stomach spasms        OB/GYN Status:  No LMP for male patient.  General Assessment Data Location of Assessment: [facility] ED Living Arrangements: Spouse/significant other Can pt return to current living arrangement?: Yes Admission Status: Voluntary Transfer from: Acute Hospital Referral Source: Self/Family/Friend     Risk to self Suicidal Ideation:  (Pt would not answer the question) Suicidal Intent:  (Pt would not answer the question) Is patient at risk for suicide?: No Suicidal Plan?:  (Pt would not answer the question) Access to Means: No Previous Attempts/Gestures: No Intentional Self Injurious Behavior: None Family Suicide History: No Recent stressful life event(s): Other (Comment) (Retirement  in August) Persecutory voices/beliefs?: No Depression: Yes Depression Symptoms: Despondent;Insomnia;Loss of interest in usual pleasures;Fatigue;Feeling worthless/self pity Substance abuse history and/or treatment for substance abuse?: No Suicide prevention information given to non-admitted patients: Not applicable  Risk to Others Homicidal Ideation: No Thoughts of Harm to Others: No Current Homicidal Intent: No Current Homicidal Plan: No Access to  Homicidal Means: No History of harm to others?: No Assessment of Violence: None Noted Does patient have access to weapons?: No Criminal Charges Pending?: No Does patient have a court date: No  Psychosis Hallucinations: None noted Delusions: None noted;Somatic;Unspecified  Mental Status Report Appear/Hygiene: Other (Comment) (unremarkable) Eye Contact: Poor Motor Activity: Psychomotor retardation Speech: Soft;Elective mutism Level of Consciousness: Quiet/awake;Drowsy Mood: Depressed Affect: Blunted (flat) Anxiety Level: None Thought Processes:  (unable to assess) Judgement:  (unable to assess) Orientation: Person Obsessive Compulsive Thoughts/Behaviors: Moderate  Cognitive Functioning Concentration: Decreased Memory: Recent Impaired;Remote Impaired IQ: Average Insight: Poor Impulse Control: Good Sleep: Decreased Vegetative Symptoms: Staying in bed  Prior Inpatient Therapy Prior Inpatient Therapy: No  Prior Outpatient Therapy Prior Outpatient Therapy: No  ADL Screening (condition at time of admission) Patient's cognitive ability adequate to safely complete daily activities?: Yes Patient able to express need for assistance with ADLs?: Yes Independently performs ADLs?: Yes       Abuse/Neglect Assessment (Assessment to be complete while patient is alone) Physical Abuse: Denies Verbal Abuse: Denies Sexual Abuse: Denies Exploitation of patient/patient's resources: Denies Self-Neglect: Denies Values / Beliefs Cultural Requests During Hospitalization: None Spiritual Requests During Hospitalization: None     Nutrition Screen Diet: Regular  Additional Information 1:1 In Past 12 Months?: No CIRT Risk: No Elopement Risk: No Does patient have medical clearance?: Yes     Disposition:     On Site Evaluation by:   Reviewed with Physician:     Steward Ros 08/23/2011 11:15 PM

## 2011-08-23 NOTE — ED Notes (Signed)
ACT team into room.

## 2011-08-23 NOTE — ED Notes (Signed)
Pt undressed, in gown, on monitor, continuous pulse oximetry, blood pressure cuff and oxygen [place] (2L); EKG performed 

## 2011-08-23 NOTE — ED Provider Notes (Signed)
History     CSN: [id number]  Arrival date & time 08/23/11  1747   First MD Initiated Contact with Patient 08/23/11 1751      Chief Complaint  Patient presents with  . Altered Mental Status    (Consider location/radiation/quality/duration/timing/severity/associated sxs/prior treatment) Patient is a 61 y.o. male presenting with altered mental status.  Altered Mental Status Associated symptoms include abdominal pain. Pertinent negatives include no chest pain, no headaches and no shortness of breath.   EMS called by pt's family for AMS of unknown onset. Pt states he does not know why he is here. He denies trauma, HA, CP, SOB, fever, chills. He has ongoing abd pain which is unchanged for which he is followed by Dr Dulce Sellar.  Past Medical History  Diagnosis Date  . Emphysema   . Tobacco abuse   . COPD (chronic obstructive pulmonary disease)     Past Surgical History  Procedure Date  . Hemorrhoid surgery   . Hemorrhoid surgery 2008    History reviewed. No pertinent family history.  History  Substance Use Topics  . Smoking status: Current Everyday Smoker -- 1.5 packs/day for 50 years  . Smokeless tobacco: Not on file  . Alcohol Use: No      Review of Systems  Constitutional: Negative for fever and chills.  HENT: Negative for neck pain.   Eyes: Negative for visual disturbance.  Respiratory: Negative for cough and shortness of breath.   Cardiovascular: Negative for chest pain.  Gastrointestinal: Positive for abdominal pain. Negative for nausea, vomiting and diarrhea.  Musculoskeletal: Negative for myalgias and back pain.  Skin: Negative for rash and wound.  Neurological: Negative for dizziness, weakness, light-headedness, numbness and headaches.  Psychiatric/Behavioral: Positive for confusion and altered mental status.    Allergies  Review of patient's allergies indicates no known allergies.  Home Medications   Current Outpatient Rx  Name Route Sig Dispense Refill    . ASPIRIN EC 81 MG PO TBEC Oral Take 81 mg by mouth daily.    Marland Kitchen EQL FIBER SUPPLEMENT PO Oral Take 1 tablet by mouth daily.    Marland Kitchen DOCUSATE SODIUM 100 MG PO CAPS Oral Take 1 capsule (100 mg total) by mouth 3 (three) times daily as needed for constipation. 60 capsule 0  . HYDROCODONE-ACETAMINOPHEN 5-325 MG PO TABS Oral Take 1-2 tablets by mouth every 4 (four) hours as needed for pain. 20 tablet 0  . HYOSCYAMINE SULFATE ER 0.375 MG PO TB12 Oral Take 0.375 mg by mouth every 12 (twelve) hours as needed. Stomach spasms      BP 128/84  Pulse 69  Temp(Src) 98.1 F (36.7 C) (Oral)  Resp 14  SpO2 97%  Physical Exam  Nursing note and vitals reviewed. Constitutional: He appears well-developed and well-nourished. No distress.  HENT:  Head: Normocephalic and atraumatic.  Mouth/Throat: Oropharynx is clear and moist.  Eyes: EOM are normal. Pupils are equal, round, and reactive to light.  Neck: Normal range of motion. Neck supple.       No posterior midline ttp.   Cardiovascular: Normal rate and regular rhythm.   Pulmonary/Chest: Effort normal and breath sounds normal. No respiratory distress. He has no wheezes. He has no rales.  Abdominal: Soft. Bowel sounds are normal. He exhibits no mass. There is tenderness (TTP LUQ, No rebound or guarding). There is no rebound and no guarding.  Musculoskeletal: Normal range of motion. He exhibits no edema and no tenderness.  Neurological: He is alert.  Pt states his first name is Jowel and thinks his last name begins with 's'. Not oriented to place or time. 5/5 motor in upper ext. Decreased strength bl lower ext. Able to raise bl legs off stretcher. Decreased sensation Bl lower ext. Finger to nose intact.   Skin: Skin is warm and dry. No rash noted. No erythema.  Psychiatric:       Question of malingering. Pt unable to recall his own last name but had no problems recalling his GI MD.     ED Course  Procedures (including critical care time)  Labs  Reviewed  CBC - Abnormal; Notable for the following:    RBC 4.02 (*)    HCT 35.7 (*)    MCHC 36.7 (*)    All other components within normal limits  COMPREHENSIVE METABOLIC PANEL - Abnormal; Notable for the following:    Sodium 126 (*)    Chloride 91 (*)    Glucose, Bld 114 (*)    GFR calc non Af Amer 70 (*)    GFR calc Af Amer 81 (*)    All other components within normal limits  AMMONIA - Abnormal; Notable for the following:    Ammonia <10 (*) REPEATED TO VERIFY   All other components within normal limits  DIFFERENTIAL  URINALYSIS, ROUTINE W REFLEX MICROSCOPIC  ETHANOL  URINE RAPID DRUG SCREEN (HOSP PERFORMED)  LAB REPORT - SCANNED   Ct Head Wo Contrast  08/23/2011  *RADIOLOGY REPORT*  Clinical Data: Altered mental status  CT HEAD WITHOUT CONTRAST  Technique:  Contiguous axial images were obtained from the base of the skull through the vertex without contrast.  Comparison: 01/06/2007 head CT  Findings: No acute intracranial abnormality is identified. Specifically, there is no hemorrhage, hydrocephalus, mass effect, mass lesion, or evidence of acute infarction.  The skull is intact. Visualized paranasal sinuses and mastoid air cells are clear.  IMPRESSION: No acute intracranial abnormality.  Original Report Authenticated By: Britta Mccreedy, M.D.     1. Confusion      Date: 08/23/2011  Rate: 77  Rhythm: normal sinus rhythm  QRS Axis: normal  Intervals: normal  ST/T Wave abnormalities: normal  Conduction Disutrbances:none  Narrative Interpretation:   Old EKG Reviewed: unchanged    MDM  On re-evaluation pt is less forthcoming with family in the room. It takes multiple prompts to get pt to say his last name. He is oriented x 3. Family states he has done this in the past and they are concerned that he will refuse any outpt f/u. Discussed with Dr Roseanne Reno who agrees no further workup is needed and that confusion appears more related to malingering or psychiatric manifestation. Will  have ACT team eval and provide outpt resources.     ACT has seen and recommends inpt psychiatric treatment and telepsych eval.   Loren Racer, MD 08/24/11 1510

## 2011-08-23 NOTE — ED Notes (Signed)
Pt & family updated, pt states, "wants to sleep, but cannot sleep", declines food/drink at this time, family x2 at [facility], paperwork sent faxed to telepsych, pending response/interview. Pt mentions again feet tingling/numb. CMS intact, cap refill < 2sec, warm equal, ppp.

## 2011-08-23 NOTE — ED Notes (Signed)
Pt has been confused off and on since late February. He kept saying his legs and arms were numb. No medical history. No allergies. Pt family states that he has a thickened wall in his colon, so he was given a laxative and stool softener. He never took medications before February besides an occasional 81 milligram Aspirin and Citrucel.

## 2011-08-23 NOTE — ED Notes (Signed)
Pt to department via EMS from home- pt was at home outside working in the yard and family is unsure about when pt was last himself. Reports that he has been having increasing abd pain and nausea over the past couple of weeks. No neuro deficits present on arrival. Pt A&ox4. Skin warm and dry. Bp- 148/98 Hr-86

## 2011-08-23 NOTE — ED Notes (Signed)
Pt alert, NAD, calm, interactive, resting HOB 15 degrees on R side, son x2 at [facility], "waiting on results", pt/family updated, (denies: pain, sob, nausea, dizziness), mentions "stomach growling" & numbness & tingling in feet, "h/o same, intermitant, periodic, not new".

## 2011-08-23 NOTE — ED Notes (Signed)
Pt back from xray, pending results, no reported changes, alert, NAD, calm, interactive, son at [facility].

## 2011-08-24 NOTE — ED Notes (Signed)
Pt/family updated, no changes, waiting for telepsych, pt resting.

## 2011-08-24 NOTE — ED Provider Notes (Signed)
Care received from prior team. Patient has been seen by neurology who recommended psychiatric evaluation. Patient has been seen by telepsych, who does not feel patient requires commitment at this time. 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 And recommend outpatient neurology workup with EEG. All results discussed with family and patient. Will have him followup with his primary care Dr. for possible start of SSRI for an underlying depression disorder  Olivia Mackie, MD 08/24/11 [phone]

## 2011-08-24 NOTE — ED Notes (Signed)
Patient is AOx4 and comfortable with his discharge instructions. 

## 2011-08-24 NOTE — Discharge Instructions (Signed)
Please followup with her primary care Dr. for possible start of medication for an underlying depression. Followup with neurology as recommended by psychiatry for possible seizure disorder. Return to the emergency room for worsening condition or new concerning symptoms  Confusion Confusion is the inability to think with your usual speed or clarity. Confusion may come on quickly or slowly over time. How quickly the confusion comes on depends on the cause. Confusion can be due to any number of causes. CAUSES   Concussion, head injury, or head trauma.   Seizures.   Stroke.   Fever.   Senility.   Heightened emotional states like rage or terror.   Mental illness in which the person loses the ability to determine what is real and what is not (hallucinations).   Infections.   Toxic effects from alcohol, drugs, or prescription medicines.   Dehydration and an imbalance of salts in the body (electrolytes).   Lack of sleep.   Low blood sugar (diabetes).   Low levels of oxygen (for example from chronic lung disorders).   Drug interactions or other medication side effects.   Nutritional deficiencies, especially niacin, thiamine, vitamin C, or vitamin B.   Sudden drop in body temperature (hypothermia).   Illness in the elderly. Constipation can result in confusion. An elderly person who is hospitalized may become confused due to change in daily routine.  SYMPTOMS  People often describe their thinking as cloudy or unclear when they are confused. Confusion can also include feeling disoriented. That means you are unaware of where or who you are. You may also not know what the date or time is. If confused, you may also have difficulty paying attention, remembering and making decisions. Some people also act aggressively when they are confused.  DIAGNOSIS  The medical evaluation of confusion may include:  Blood and urine tests.   X-rays.   Brain and nervous system tests.   Analyzing your  brain waves (electroencphalogram or EEG).   A special X-ray (MRI) of your head or other special studies.  Your physician will ask questions such as:  Do you get days and nights mixed up?   Are you awake during regular sleep times?   Do you have trouble recognizing people?   Do you know where you are?   Do you know the date and time?   Does the confusion come and go?   Is the confusion quickly getting worse?   Has there been a recent illness?   Has there been a recent head injury?   Are you diabetic?   Do you have a lung disorder?   What medication are you taking?   Have you taken drugs or alcohol?  TREATMENT  An admission to the hospital may not be needed, but a confused person should not be left alone. Stay with a family member or friend until the confusion clears. Avoid alcohol, pain relievers or sedative drugs until you have fully recovered. Do not drive until your caregiver says it is okay. HOME CARE INSTRUCTIONS What family and friends can do:  To find out if someone is confused ask him or her their name, age, and the date. If the person is unsure or answers incorrectly, he or she is confused.   Always introduce yourself, no matter how well the person knows you.   Often remind the person of his or her location.   Place a calendar and clock near the confused person.   Talk about current events and plans for the  day.   Try to keep the environment calm, quiet and peaceful.   Make sure the patient keeps follow up appointments with their physician.  PREVENTION  Ways to prevent confusion:  Avoid alcohol.   Eat a balanced diet.   Get enough sleep.   Do not become isolated. Spend time with other people and make plans for your days.   Keep careful watch on your blood sugar levels if you are diabetic.  SEEK IMMEDIATE MEDICAL CARE IF:   You develop severe headaches, repeated vomiting, seizures, blackouts or slurred speech.   There is increasing confusion,  weakness, numbness, restlessness or personality changes.   You develop a loss of balance, have marked dizziness, feel uncoordinated or fall.   You have delusions, hallucinations or develop severe anxiety.   Your family members think you need to be rechecked.  Document Released: 06/01/2004 Document Revised: 04/13/2011 Document Reviewed: 01/28/2008 [facility] Patient Information 2012 [place], [place].

## 2011-08-24 NOTE — ED Notes (Signed)
telepsych interview beginning, family x4 present with pt in room.

## 2011-08-24 NOTE — ED Notes (Signed)
Ambulatory to b/r, steady gait, with wife.

## 2011-09-23 ENCOUNTER — Encounter (HOSPITAL_COMMUNITY): Payer: Self-pay | Admitting: *Deleted

## 2011-09-23 ENCOUNTER — Emergency Department (HOSPITAL_COMMUNITY): Payer: BC Managed Care – PPO

## 2011-09-23 ENCOUNTER — Inpatient Hospital Stay (HOSPITAL_COMMUNITY): Payer: BC Managed Care – PPO

## 2011-09-23 ENCOUNTER — Inpatient Hospital Stay (HOSPITAL_COMMUNITY)
Admission: EM | Admit: 2011-09-23 | Discharge: 2011-09-25 | DRG: 463 | Disposition: A | Discharge status: Home / Self Care | Payer: BC Managed Care – PPO | Attending: Internal Medicine | Admitting: Internal Medicine

## 2011-09-23 DIAGNOSIS — F329 Major depressive disorder, single episode, unspecified: Secondary | ICD-10-CM | POA: Diagnosis present

## 2011-09-23 DIAGNOSIS — E871 Hypo-osmolality and hyponatremia: Secondary | ICD-10-CM | POA: Diagnosis present

## 2011-09-23 DIAGNOSIS — R7309 Other abnormal glucose: Secondary | ICD-10-CM | POA: Diagnosis present

## 2011-09-23 DIAGNOSIS — R7401 Elevation of levels of liver transaminase levels: Secondary | ICD-10-CM | POA: Diagnosis present

## 2011-09-23 DIAGNOSIS — R0602 Shortness of breath: Secondary | ICD-10-CM

## 2011-09-23 DIAGNOSIS — R52 Pain, unspecified: Secondary | ICD-10-CM

## 2011-09-23 DIAGNOSIS — F17200 Nicotine dependence, unspecified, uncomplicated: Secondary | ICD-10-CM | POA: Diagnosis present

## 2011-09-23 DIAGNOSIS — F3289 Other specified depressive episodes: Secondary | ICD-10-CM | POA: Diagnosis present

## 2011-09-23 DIAGNOSIS — R7402 Elevation of levels of lactic acid dehydrogenase (LDH): Secondary | ICD-10-CM | POA: Diagnosis present

## 2011-09-23 DIAGNOSIS — R4182 Altered mental status, unspecified: Principal | ICD-10-CM | POA: Diagnosis present

## 2011-09-23 DIAGNOSIS — D649 Anemia, unspecified: Secondary | ICD-10-CM | POA: Diagnosis present

## 2011-09-23 DIAGNOSIS — J4489 Other specified chronic obstructive pulmonary disease: Secondary | ICD-10-CM | POA: Diagnosis present

## 2011-09-23 DIAGNOSIS — R404 Transient alteration of awareness: Secondary | ICD-10-CM

## 2011-09-23 DIAGNOSIS — J449 Chronic obstructive pulmonary disease, unspecified: Secondary | ICD-10-CM | POA: Diagnosis present

## 2011-09-23 DIAGNOSIS — R1032 Left lower quadrant pain: Secondary | ICD-10-CM | POA: Diagnosis present

## 2011-09-23 DIAGNOSIS — R410 Disorientation, unspecified: Secondary | ICD-10-CM

## 2011-09-23 HISTORY — DX: Depression, unspecified: F32.A

## 2011-09-23 HISTORY — DX: Major depressive disorder, single episode, unspecified: F32.9

## 2011-09-23 LAB — COMPREHENSIVE METABOLIC PANEL
ALT: 177 U/L — ABNORMAL HIGH (ref 0–53)
ALT: 153 U/L — ABNORMAL HIGH (ref 0–53)
AST: 99 U/L — ABNORMAL HIGH (ref 0–37)
AST: 83 U/L — ABNORMAL HIGH (ref 0–37)
Albumin: 4.4 g/dL (ref 3.5–5.2)
Albumin: 3.8 g/dL (ref 3.5–5.2)
Alkaline Phosphatase: 129 U/L — ABNORMAL HIGH (ref 39–117)
Alkaline Phosphatase: 111 U/L (ref 39–117)
BUN: 18 mg/dL (ref 6–23)
BUN: 19 mg/dL (ref 6–23)
CO2: 21 mEq/L (ref 19–32)
CO2: 22 mEq/L (ref 19–32)
Calcium: 9 mg/dL (ref 8.4–10.5)
Calcium: 8.6 mg/dL (ref 8.4–10.5)
Chloride: 93 mEq/L — ABNORMAL LOW (ref 96–112)
Chloride: 93 mEq/L — ABNORMAL LOW (ref 96–112)
Creatinine, Ser: 1.03 mg/dL (ref 0.50–1.35)
Creatinine, Ser: 1.01 mg/dL (ref 0.50–1.35)
GFR calc Af Amer: 89 mL/min — ABNORMAL LOW (ref >90)
GFR calc Af Amer: >90 mL/min (ref >90)
GFR calc non Af Amer: 77 mL/min — ABNORMAL LOW (ref >90)
GFR calc non Af Amer: 79 mL/min — ABNORMAL LOW (ref >90)
Glucose, Bld: 194 mg/dL — ABNORMAL HIGH (ref 70–99)
Glucose, Bld: 197 mg/dL — ABNORMAL HIGH (ref 70–99)
Potassium: 3.8 mEq/L (ref 3.5–5.1)
Potassium: 3.5 mEq/L (ref 3.5–5.1)
Sodium: 128 mEq/L — ABNORMAL LOW (ref 135–145)
Sodium: 126 mEq/L — ABNORMAL LOW (ref 135–145)
Total Bilirubin: 0.4 mg/dL (ref 0.3–1.2)
Total Bilirubin: 0.4 mg/dL (ref 0.3–1.2)
Total Protein: 7.3 g/dL (ref 6.0–8.3)
Total Protein: 6.4 g/dL (ref 6.0–8.3)

## 2011-09-23 LAB — URINALYSIS, ROUTINE W REFLEX MICROSCOPIC
Bilirubin Urine: NEGATIVE
Glucose, UA: NEGATIVE mg/dL
Hgb urine dipstick: NEGATIVE
Ketones, ur: NEGATIVE mg/dL
Nitrite: NEGATIVE
Protein, ur: NEGATIVE mg/dL
Specific Gravity, Urine: 1.022 (ref 1.005–1.030)
Urobilinogen, UA: 1 mg/dL (ref 0.0–1.0)
pH: 6.5 (ref 5.0–8.0)

## 2011-09-23 LAB — BLOOD GAS, VENOUS
Acid-Base Excess: 1.1 mmol/L (ref 0.0–2.0)
Bicarbonate: 25.9 mEq/L — ABNORMAL HIGH (ref 20.0–24.0)
Drawn by: 362341
FIO2: 0.21 %
O2 Saturation: 65.4 %
Patient temperature: 98.6
TCO2: 24.3 mmol/L (ref 0–100)
pCO2, Ven: 45 mmHg (ref 45.0–50.0)
pH, Ven: 7.378 — ABNORMAL HIGH (ref 7.250–7.300)
pO2, Ven: 36.3 mmHg (ref 30.0–45.0)

## 2011-09-23 LAB — CARDIAC PANEL(CRET KIN+CKTOT+MB+TROPI)
CK, MB: 2.3 ng/mL (ref 0.3–4.0)
Relative Index: 1.5 (ref 0.0–2.5)
Total CK: 149 U/L (ref 7–232)
Troponin I: <0.3 ng/mL (ref <0.30)

## 2011-09-23 LAB — CBC
HCT: 34.6 % — ABNORMAL LOW (ref 39.0–52.0)
HCT: 28.9 % — ABNORMAL LOW (ref 39.0–52.0)
Hemoglobin: 12.3 g/dL — ABNORMAL LOW (ref 13.0–17.0)
Hemoglobin: 10.3 g/dL — ABNORMAL LOW (ref 13.0–17.0)
MCH: 32.1 pg (ref 26.0–34.0)
MCH: 32.5 pg (ref 26.0–34.0)
MCHC: 35.5 g/dL (ref 30.0–36.0)
MCHC: 35.6 g/dL (ref 30.0–36.0)
MCV: 90.3 fL (ref 78.0–100.0)
MCV: 91.2 fL (ref 78.0–100.0)
Platelets: 332 10*3/uL (ref 150–400)
Platelets: 228 10*3/uL (ref 150–400)
RBC: 3.83 MIL/uL — ABNORMAL LOW (ref 4.22–5.81)
RBC: 3.17 MIL/uL — ABNORMAL LOW (ref 4.22–5.81)
RDW: 12.7 % (ref 11.5–15.5)
RDW: 12.7 % (ref 11.5–15.5)
WBC: 8 10*3/uL (ref 4.0–10.5)
WBC: 7.4 10*3/uL (ref 4.0–10.5)

## 2011-09-23 LAB — RAPID URINE DRUG SCREEN, HOSP PERFORMED
Amphetamines: NOT DETECTED
Barbiturates: NOT DETECTED
Benzodiazepines: NOT DETECTED
Cocaine: NOT DETECTED
Opiates: NOT DETECTED
Tetrahydrocannabinol: NOT DETECTED

## 2011-09-23 LAB — MAGNESIUM: Magnesium: 1.9 mg/dL (ref 1.5–2.5)

## 2011-09-23 LAB — DIFFERENTIAL
Basophils Absolute: 0 10*3/uL (ref 0.0–0.1)
Basophils Relative: 0 % (ref 0–1)
Eosinophils Absolute: 0 10*3/uL (ref 0.0–0.7)
Eosinophils Relative: 0 % (ref 0–5)
Lymphocytes Relative: 13 % (ref 12–46)
Lymphs Abs: 1 10*3/uL (ref 0.7–4.0)
Monocytes Absolute: 0.5 10*3/uL (ref 0.1–1.0)
Monocytes Relative: 7 % (ref 3–12)
Neutro Abs: 5.9 10*3/uL (ref 1.7–7.7)
Neutrophils Relative %: 80 % — ABNORMAL HIGH (ref 43–77)

## 2011-09-23 LAB — APTT: aPTT: 33 seconds (ref 24–37)

## 2011-09-23 LAB — URINE MICROSCOPIC-ADD ON

## 2011-09-23 LAB — LACTIC ACID, PLASMA: Lactic Acid, Venous: 1.9 mmol/L (ref 0.5–2.2)

## 2011-09-23 LAB — ETHANOL: Alcohol, Ethyl (B): <11 mg/dL (ref 0–11)

## 2011-09-23 LAB — AMMONIA: Ammonia: 26 umol/L (ref 11–60)

## 2011-09-23 LAB — PROCALCITONIN: Procalcitonin: <0.1 ng/mL

## 2011-09-23 LAB — PROTIME-INR
INR: 0.98 (ref 0.00–1.49)
Prothrombin Time: 13.2 seconds (ref 11.6–15.2)

## 2011-09-23 LAB — PHOSPHORUS: Phosphorus: 3.2 mg/dL (ref 2.3–4.6)

## 2011-09-23 LAB — CK: Total CK: 87 U/L (ref 7–232)

## 2011-09-23 MED ORDER — HYOSCYAMINE SULFATE ER 0.375 MG PO TB12
0.3750 mg | ORAL_TABLET | Freq: Two times a day (BID) | ORAL | Status: DC
Start: 2011-09-23 — End: 2011-09-25
  Filled 2011-09-23 (×5): qty 1

## 2011-09-23 MED ORDER — SODIUM CHLORIDE 0.9 % IV SOLN
INTRAVENOUS | Status: DC
  Administered 2011-09-24 (×2): 1000 mL via INTRAVENOUS

## 2011-09-23 MED ORDER — ENOXAPARIN SODIUM 40 MG/0.4ML ~~LOC~~ SOLN
40.0000 mg | SUBCUTANEOUS | Status: DC
  Administered 2011-09-24: 40 mg via SUBCUTANEOUS
  Filled 2011-09-23 (×3): qty 0.4

## 2011-09-23 MED ORDER — CLONAZEPAM 0.5 MG PO TABS
0.5000 mg | ORAL_TABLET | Freq: Every day | ORAL | Status: DC
  Administered 2011-09-23 – 2011-09-24 (×2): 0.5 mg via ORAL
  Filled 2011-09-23 (×2): qty 1

## 2011-09-23 MED ORDER — ASPIRIN EC 81 MG PO TBEC
81.0000 mg | DELAYED_RELEASE_TABLET | Freq: Every day | ORAL | Status: DC
  Administered 2011-09-24 – 2011-09-25 (×2): 81 mg via ORAL
  Filled 2011-09-23 (×3): qty 1

## 2011-09-23 MED ORDER — ENOXAPARIN SODIUM 30 MG/0.3ML ~~LOC~~ SOLN: 30.0000 mg | SUBCUTANEOUS | Status: DC

## 2011-09-23 MED ORDER — SODIUM CHLORIDE 0.9 % IV SOLN: INTRAVENOUS | Status: DC

## 2011-09-23 MED ORDER — SERTRALINE HCL 50 MG PO TABS
50.0000 mg | ORAL_TABLET | Freq: Every day | ORAL | Status: DC
Start: 2011-09-23 — End: 2011-09-25
  Administered 2011-09-24 – 2011-09-25 (×2): 50 mg via ORAL
  Filled 2011-09-23 (×2): qty 1

## 2011-09-23 MED ORDER — NICOTINE 21 MG/24HR TD PT24
21.0000 mg | MEDICATED_PATCH | Freq: Every day | TRANSDERMAL | Status: DC
  Filled 2011-09-23 (×3): qty 1

## 2011-09-23 MED ORDER — MIDAZOLAM HCL 10 MG/2ML IJ SOLN
5.0000 mg | Freq: Once | INTRAMUSCULAR | Status: AC
  Administered 2011-09-23: 5 mg via INTRAMUSCULAR
  Filled 2011-09-23: qty 1

## 2011-09-23 NOTE — ED Notes (Signed)
Per family pt began not acting normal yesterday, today was more altered, not following directions, belligerent. Family brought pt to ED for check of sodium, per family similar episode in past d/t sodium. Pt combative on arrival. Pt placed in 4 point hard restrained, IM medication given, IV est, labs dawn.  1545 Pt much calmer, restraints removed, pt talking with staff and wishes to roll on side to rest. Family at bedside. Pt compliant with testing at this time.

## 2011-09-23 NOTE — H&P (Signed)
PCP:  Kaleen Mask, MD, MD   DOA:  09/23/2011  1:54 PM  Chief Complaint:  Change in mental status  HPI: Patient is a 61 year old male who presents to [facility] ED with main concern of increasing combativeness and agitation, cursing over the past 3 days. Patient stating he's short of breath but is not sure when it started and it has been getting progressively worse. Per son's report, patient has become more agitated gradually over the past 3 days and has never had similar episode before. Son is unaware of pt's concerns such as chest pain, abdominal or urinary concerns but does report that pt has been in his usual state of health and this was of rather sudden onset. No known fevers, chills, changes in appetite and weight.  Allergies: No Known Allergies  Prior to Admission medications   Medication Sig Start Date End Date Taking? Authorizing Provider  aspirin EC 81 MG tablet Take 81 mg by mouth daily.   Yes Historical Provider, MD  clonazePAM (KLONOPIN) 0.5 MG tablet Take 0.5 mg by mouth at bedtime as needed. sleep   Yes Historical Provider, MD  hyoscyamine (LEVBID) 0.375 MG 12 hr tablet Take 0.375 mg by mouth 2 (two) times daily. Stomach spasms   Yes Historical Provider, MD  sertraline (ZOLOFT) 50 MG tablet Take 50 mg by mouth daily.   Yes Historical Provider, MD    Past Medical History  Diagnosis Date  . Emphysema   . Tobacco abuse   . COPD (chronic obstructive pulmonary disease)   . Depression     Past Surgical History  Procedure Date  . Hemorrhoid surgery   . Hemorrhoid surgery 2008    Social History:  reports that he has been smoking Cigarettes.  He has a 75 pack-year smoking history. He has never used smokeless tobacco. He reports that he does not drink alcohol or use illicit drugs.  History reviewed. No pertinent family history.  Review of Systems:  Unable to obtain detail ROS from the pt given change in mental status   Physical Exam:  Filed Vitals:   09/23/11 1516  09/23/11 1521 09/23/11 1545 09/23/11 1547  BP: 126/70 128/70 128/70   Pulse:  92 76   Temp:   97.7 F (36.5 C) 97.7 F (36.5 C)  TempSrc:   Rectal Rectal  Resp: 18 12    Weight:      SpO2:  98% 97%     Constitutional: Vital signs reviewed.  Patient is in no acute distress, not cooperative to examination Head: Normocephalic and atraumatic Ear: TM normal bilaterally Mouth: no erythema or exudates, dry MM Eyes: PERRL, EOMI, conjunctivae normal, No scleral icterus.  Neck: Supple, Trachea midline normal ROM, No JVD, mass, thyromegaly, or carotid bruit present.  Cardiovascular: RRR, S1 normal, S2 normal, no MRG, pulses symmetric and intact bilaterally Pulmonary/Chest: CTAB but decreased breath sounds at bases, no wheezes, rales, or rhonchi Abdominal: Soft. Non-tender, non-distended, bowel sounds are normal, no masses, organomegaly, or guarding present.  Musculoskeletal: No joint deformities, erythema, or stiffness, ROM full and no nontender Ext: no edema and no cyanosis, pulses palpable bilaterally (DP and PT) Neurological: A&O to name only, follows some commands appropriately, moves all 4 extremities against the gravity Skin: Warm, dry and intact. No rash, cyanosis, or clubbing.   Labs on Admission:  Results for orders placed during the hospital encounter of 09/23/11 (from the past 48 hour(s))  CBC     Status: Abnormal   Collection Time   09/23/11  2:35 PM      Component Value Range Comment   WBC 8.0  4.0 - 10.5 (K/uL)    RBC 3.83 (*) 4.22 - 5.81 (MIL/uL)    Hemoglobin 12.3 (*) 13.0 - 17.0 (g/dL)    HCT 16.1 (*) 09.6 - 52.0 (%)    MCV 90.3  78.0 - 100.0 (fL)    MCH 32.1  26.0 - 34.0 (pg)    MCHC 35.5  30.0 - 36.0 (g/dL)    RDW 04.5  40.9 - 81.1 (%)    Platelets 332  150 - 400 (K/uL)   COMPREHENSIVE METABOLIC PANEL     Status: Abnormal   Collection Time   09/23/11  2:35 PM      Component Value Range Comment   Sodium 128 (*) 135 - 145 (mEq/L)    Potassium 3.8  3.5 - 5.1 (mEq/L)      Chloride 93 (*) 96 - 112 (mEq/L)    CO2 21  19 - 32 (mEq/L)    Glucose, Bld 194 (*) 70 - 99 (mg/dL)    BUN 18  6 - 23 (mg/dL)    Creatinine, Ser 9.14  0.50 - 1.35 (mg/dL)    Calcium 9.0  8.4 - 10.5 (mg/dL)    Total Protein 7.3  6.0 - 8.3 (g/dL)    Albumin 4.4  3.5 - 5.2 (g/dL)    AST 99 (*) 0 - 37 (U/L)    ALT 177 (*) 0 - 53 (U/L)    Alkaline Phosphatase 129 (*) 39 - 117 (U/L)    Total Bilirubin 0.4  0.3 - 1.2 (mg/dL)    GFR calc non Af Amer 77 (*) >90 (mL/min)    GFR calc Af Amer 89 (*) >90 (mL/min)   ETHANOL     Status: Normal   Collection Time   09/23/11  2:35 PM      Component Value Range Comment   Alcohol, Ethyl (B) <11  0 - 11 (mg/dL)   URINE RAPID DRUG SCREEN (HOSP PERFORMED)     Status: Normal   Collection Time   09/23/11  3:27 PM      Component Value Range Comment   Opiates NONE DETECTED  NONE DETECTED     Cocaine NONE DETECTED  NONE DETECTED     Benzodiazepines NONE DETECTED  NONE DETECTED     Amphetamines NONE DETECTED  NONE DETECTED     Tetrahydrocannabinol NONE DETECTED  NONE DETECTED     Barbiturates NONE DETECTED  NONE DETECTED    URINALYSIS, ROUTINE W REFLEX MICROSCOPIC     Status: Abnormal   Collection Time   09/23/11  3:27 PM      Component Value Range Comment   Color, Urine YELLOW  YELLOW     APPearance CLEAR  CLEAR     Specific Gravity, Urine 1.022  1.005 - 1.030     pH 6.5  5.0 - 8.0     Glucose, UA NEGATIVE  NEGATIVE (mg/dL)    Hgb urine dipstick NEGATIVE  NEGATIVE     Bilirubin Urine NEGATIVE  NEGATIVE     Ketones, ur NEGATIVE  NEGATIVE (mg/dL)    Protein, ur NEGATIVE  NEGATIVE (mg/dL)    Urobilinogen, UA 1.0  0.0 - 1.0 (mg/dL)    Nitrite NEGATIVE  NEGATIVE     Leukocytes, UA SMALL (*) NEGATIVE    URINE MICROSCOPIC-ADD ON     Status: Normal   Collection Time   09/23/11  3:27 PM  Component Value Range Comment   Squamous Epithelial / LPF RARE  RARE     WBC, UA 0-2  <3 (WBC/hpf)    Bacteria, UA RARE  RARE    COMPREHENSIVE METABOLIC PANEL      Status: Abnormal   Collection Time   09/23/11  3:30 PM      Component Value Range Comment   Sodium 126 (*) 135 - 145 (mEq/L)    Potassium 3.5  3.5 - 5.1 (mEq/L)    Chloride 93 (*) 96 - 112 (mEq/L)    CO2 22  19 - 32 (mEq/L)    Glucose, Bld 197 (*) 70 - 99 (mg/dL)    BUN 19  6 - 23 (mg/dL)    Creatinine, Ser 1.61  0.50 - 1.35 (mg/dL)    Calcium 8.6  8.4 - 10.5 (mg/dL)    Total Protein 6.4  6.0 - 8.3 (g/dL)    Albumin 3.8  3.5 - 5.2 (g/dL)    AST 83 (*) 0 - 37 (U/L)    ALT 153 (*) 0 - 53 (U/L)    Alkaline Phosphatase 111  39 - 117 (U/L)    Total Bilirubin 0.4  0.3 - 1.2 (mg/dL)    GFR calc non Af Amer 79 (*) >90 (mL/min)    GFR calc Af Amer >90  >90 (mL/min)   CBC     Status: Abnormal   Collection Time   09/23/11  3:30 PM      Component Value Range Comment   WBC 7.4  4.0 - 10.5 (K/uL)    RBC 3.17 (*) 4.22 - 5.81 (MIL/uL)    Hemoglobin 10.3 (*) 13.0 - 17.0 (g/dL)    HCT 09.6 (*) 04.5 - 52.0 (%)    MCV 91.2  78.0 - 100.0 (fL)    MCH 32.5  26.0 - 34.0 (pg)    MCHC 35.6  30.0 - 36.0 (g/dL)    RDW 40.9  81.1 - 91.4 (%)    Platelets 228  150 - 400 (K/uL) DELTA CHECK NOTED  DIFFERENTIAL     Status: Abnormal   Collection Time   09/23/11  3:30 PM      Component Value Range Comment   Neutrophils Relative 80 (*) 43 - 77 (%)    Neutro Abs 5.9  1.7 - 7.7 (K/uL)    Lymphocytes Relative 13  12 - 46 (%)    Lymphs Abs 1.0  0.7 - 4.0 (K/uL)    Monocytes Relative 7  3 - 12 (%)    Monocytes Absolute 0.5  0.1 - 1.0 (K/uL)    Eosinophils Relative 0  0 - 5 (%)    Eosinophils Absolute 0.0  0.0 - 0.7 (K/uL)    Basophils Relative 0  0 - 1 (%)    Basophils Absolute 0.0  0.0 - 0.1 (K/uL)   CK     Status: Normal   Collection Time   09/23/11  3:30 PM      Component Value Range Comment   Total CK 87  7 - 232 (U/L)   AMMONIA     Status: Normal   Collection Time   09/23/11  3:30 PM      Component Value Range Comment   Ammonia 26  11 - 60 (umol/L)   LACTIC ACID, PLASMA     Status: Normal    Collection Time   09/23/11  3:30 PM      Component Value Range Comment   Lactic Acid, Venous 1.9  0.5 -  2.2 (mmol/L)     Radiological Exams on Admission: No results found.  Assessment/Plan  Active Problems:  Altered mental status - unclear etiology at this time - obtain CT head, UDS and ammonia level unremarkable - appreciate psychiatry  - will admit the pt to medical floor for further evaluation and management - will provide supportive care with IVF, antiemetics and analgesia as needed for adequate pain control - obtain CE's, TSH  Transaminitis - unclear etiology - will obtain hepatitis panel - check Alcohol level - CMET in AM  Hyponatremia - likely of prerenal etiology - will continue IVF - BMP in AM  Anemia - appears to be stable at this point - CBC in AM  Hyperglycemia - will check A1C  DVT Prophylaxis - Lovenox  Code Status - Full  Education  - test results and diagnostic studies were discussed with pt's family who was present at the bedside - family have verbalized the understanding - questions were answered at the bedside and contact information was provided for additional questions or concerns  Time Spent on Admission: Over 30 minutes  Oneisha Ammons 09/23/2011, 4:50 PM  [facility] Pager # [phone] Main Office # [phone]

## 2011-09-23 NOTE — ED Provider Notes (Addendum)
History     CSN: [id number]  Arrival date & time 09/23/11  1353   None     Chief Complaint  Patient presents with  . V70.1  . Generalized pain   . Shortness of Breath   Level V caveat altered mental status . History is obtained from his sons (Consider location/radiation/quality/duration/timing/severity/associated sxs/prior treatment) HPI Patient is a 61 year old male with increasing combativeness and agitation, cursing over the past 3 days. Patient stating he's short of breath patient has refused to all care. Son's estate he has become more agitated gradually over the past 3 days has never had similar episode before. No treatment prior to coming here. Patient states he short of breath denies other complaint. Son's report he's complained of abdominal pain since February at left lower quadrant. He has been eating well. No fever. No vomiting.. Past Medical History  Diagnosis Date  . Emphysema   . Tobacco abuse   . COPD (chronic obstructive pulmonary disease)   . Depression     Past Surgical History  Procedure Date  . Hemorrhoid surgery   . Hemorrhoid surgery 2008    History reviewed. No pertinent family history.  History  Substance Use Topics  . Smoking status: Current Everyday Smoker -- 1.5 packs/day for 50 years    Types: Cigarettes  . Smokeless tobacco: Never Used  . Alcohol Use: No      Review of Systems  Unable to perform ROS: Mental status change    Allergies  Review of patient's allergies indicates no known allergies.  Home Medications   Current Outpatient Rx  Name Route Sig Dispense Refill  . ASPIRIN EC 81 MG PO TBEC Oral Take 81 mg by mouth daily.    Marland Kitchen CLONAZEPAM 0.5 MG PO TABS Oral Take 0.5 mg by mouth at bedtime as needed. sleep    . HYOSCYAMINE SULFATE ER 0.375 MG PO TB12 Oral Take 0.375 mg by mouth 2 (two) times daily. Stomach spasms    . SERTRALINE HCL 50 MG PO TABS Oral Take 50 mg by mouth daily.      BP 135/96  Pulse 127  Temp(Src) 97.4 F  (36.3 C) (Oral)  Resp 20  Wt 135 lb (61.236 kg)  SpO2 99%  Physical Exam  Nursing note and vitals reviewed. Constitutional: He appears well-developed and well-nourished.       Uncooperative with exam and questioning  HENT:  Head: Normocephalic and atraumatic.  Eyes: Conjunctivae are normal. Pupils are equal, round, and reactive to light.  Neck: Neck supple. No tracheal deviation present. No thyromegaly present.  Cardiovascular: Normal rate and regular rhythm.   No murmur heard.      Tachycardic  Pulmonary/Chest: Effort normal and breath sounds normal.  Abdominal: Soft. Bowel sounds are normal. He exhibits no distension.  Musculoskeletal: Normal range of motion. He exhibits no edema and no tenderness.  Neurological: He is alert. Coordination normal.       Moves all extremities motor strength 5 over 5 overall refuses to answer questions cranial nerves II through XII grossly intact talkative of alert  Skin: Skin is warm and dry. No rash noted.  Psychiatric: He has a normal mood and affect.    ED Course  Procedures (including critical care time) I asked patient if he was likely to become violent,, his response , "I might." Patient was placed in 4 point restraints , as he was concerned the patient might be harm to staff and violent Medicated with Versed 5 mg IM At 4  PM patient is sleeping arousable to verbal stimulus is states his name appropriately follow simple commands cooperative Restraints been removed  Labs Reviewed  CBC - Abnormal; Notable for the following:    RBC 3.83 (*)    Hemoglobin 12.3 (*)    HCT 34.6 (*)    All other components within normal limits  COMPREHENSIVE METABOLIC PANEL  ETHANOL  URINE RAPID DRUG SCREEN (HOSP PERFORMED)   No results found.   No diagnosis found.   Date: 09/23/2011  Rate: 75  Rhythm: normal sinus rhythm  QRS Axis: normal  Intervals: normal  ST/T Wave abnormalities: normal  Conduction Disutrbances: none  Narrative Interpretation:  unremarkable and unchanged from 08/23/11  Results for orders placed during the hospital encounter of 09/23/11  CBC      Component Value Range   WBC 8.0  4.0 - 10.5 (K/uL)   RBC 3.83 (*) 4.22 - 5.81 (MIL/uL)   Hemoglobin 12.3 (*) 13.0 - 17.0 (g/dL)   HCT 11.9 (*) 14.7 - 52.0 (%)   MCV 90.3  78.0 - 100.0 (fL)   MCH 32.1  26.0 - 34.0 (pg)   MCHC 35.5  30.0 - 36.0 (g/dL)   RDW 82.9  56.2 - 13.0 (%)   Platelets 332  150 - 400 (K/uL)  COMPREHENSIVE METABOLIC PANEL      Component Value Range   Sodium 128 (*) 135 - 145 (mEq/L)   Potassium 3.8  3.5 - 5.1 (mEq/L)   Chloride 93 (*) 96 - 112 (mEq/L)   CO2 21  19 - 32 (mEq/L)   Glucose, Bld 194 (*) 70 - 99 (mg/dL)   BUN 18  6 - 23 (mg/dL)   Creatinine, Ser 8.65  0.50 - 1.35 (mg/dL)   Calcium 9.0  8.4 - 78.4 (mg/dL)   Total Protein 7.3  6.0 - 8.3 (g/dL)   Albumin 4.4  3.5 - 5.2 (g/dL)   AST 99 (*) 0 - 37 (U/L)   ALT 177 (*) 0 - 53 (U/L)   Alkaline Phosphatase 129 (*) 39 - 117 (U/L)   Total Bilirubin 0.4  0.3 - 1.2 (mg/dL)   GFR calc non Af Amer 77 (*) >90 (mL/min)   GFR calc Af Amer 89 (*) >90 (mL/min)  ETHANOL      Component Value Range   Alcohol, Ethyl (B) <11  0 - 11 (mg/dL)  URINE RAPID DRUG SCREEN (HOSP PERFORMED)      Component Value Range   Opiates NONE DETECTED  NONE DETECTED    Cocaine NONE DETECTED  NONE DETECTED    Benzodiazepines NONE DETECTED  NONE DETECTED    Amphetamines NONE DETECTED  NONE DETECTED    Tetrahydrocannabinol NONE DETECTED  NONE DETECTED    Barbiturates NONE DETECTED  NONE DETECTED   COMPREHENSIVE METABOLIC PANEL      Component Value Range   Sodium 126 (*) 135 - 145 (mEq/L)   Potassium 3.5  3.5 - 5.1 (mEq/L)   Chloride 93 (*) 96 - 112 (mEq/L)   CO2 22  19 - 32 (mEq/L)   Glucose, Bld 197 (*) 70 - 99 (mg/dL)   BUN 19  6 - 23 (mg/dL)   Creatinine, Ser 6.96  0.50 - 1.35 (mg/dL)   Calcium 8.6  8.4 - 29.5 (mg/dL)   Total Protein 6.4  6.0 - 8.3 (g/dL)   Albumin 3.8  3.5 - 5.2 (g/dL)   AST 83 (*) 0 - 37  (U/L)   ALT 153 (*) 0 - 53 (U/L)  Alkaline Phosphatase 111  39 - 117 (U/L)   Total Bilirubin 0.4  0.3 - 1.2 (mg/dL)   GFR calc non Af Amer 79 (*) >90 (mL/min)   GFR calc Af Amer >90  >90 (mL/min)  CBC      Component Value Range   WBC 7.4  4.0 - 10.5 (K/uL)   RBC 3.17 (*) 4.22 - 5.81 (MIL/uL)   Hemoglobin 10.3 (*) 13.0 - 17.0 (g/dL)   HCT 16.1 (*) 09.6 - 52.0 (%)   MCV 91.2  78.0 - 100.0 (fL)   MCH 32.5  26.0 - 34.0 (pg)   MCHC 35.6  30.0 - 36.0 (g/dL)   RDW 04.5  40.9 - 81.1 (%)   Platelets 228  150 - 400 (K/uL)  DIFFERENTIAL      Component Value Range   Neutrophils Relative 80 (*) 43 - 77 (%)   Neutro Abs 5.9  1.7 - 7.7 (K/uL)   Lymphocytes Relative 13  12 - 46 (%)   Lymphs Abs 1.0  0.7 - 4.0 (K/uL)   Monocytes Relative 7  3 - 12 (%)   Monocytes Absolute 0.5  0.1 - 1.0 (K/uL)   Eosinophils Relative 0  0 - 5 (%)   Eosinophils Absolute 0.0  0.0 - 0.7 (K/uL)   Basophils Relative 0  0 - 1 (%)   Basophils Absolute 0.0  0.0 - 0.1 (K/uL)  URINALYSIS, ROUTINE W REFLEX MICROSCOPIC      Component Value Range   Color, Urine YELLOW  YELLOW    APPearance CLEAR  CLEAR    Specific Gravity, Urine 1.022  1.005 - 1.030    pH 6.5  5.0 - 8.0    Glucose, UA NEGATIVE  NEGATIVE (mg/dL)   Hgb urine dipstick NEGATIVE  NEGATIVE    Bilirubin Urine NEGATIVE  NEGATIVE    Ketones, ur NEGATIVE  NEGATIVE (mg/dL)   Protein, ur NEGATIVE  NEGATIVE (mg/dL)   Urobilinogen, UA 1.0  0.0 - 1.0 (mg/dL)   Nitrite NEGATIVE  NEGATIVE    Leukocytes, UA SMALL (*) NEGATIVE   CK      Component Value Range   Total CK 87  7 - 232 (U/L)  AMMONIA      Component Value Range   Ammonia 26  11 - 60 (umol/L)  LACTIC ACID, PLASMA      Component Value Range   Lactic Acid, Venous 1.9  0.5 - 2.2 (mmol/L)  URINE MICROSCOPIC-ADD ON      Component Value Range   Squamous Epithelial / LPF RARE  RARE    WBC, UA 0-2  <3 (WBC/hpf)   Bacteria, UA RARE  RARE    Dg Chest Port 1 View  09/23/2011  *RADIOLOGY REPORT*  Clinical  Data: Shortness of breath.  PORTABLE CHEST - 1 VIEW  Comparison: 08/12/2011  Findings: The cardiomediastinal silhouette is unremarkable. COPD/emphysema again noted. There is no evidence of focal airspace disease, pulmonary edema, suspicious pulmonary nodule/mass, pleural effusion, or pneumothorax. No acute bony abnormalities are identified.  IMPRESSION: No evidence of acute cardiopulmonary disease.  COPD/emphysema.  Original Report Authenticated By: Rosendo Gros, M.D.   Old hospital records reviewed;family notified of need for admission Medical decision making; Patient likely suffering from acute delirium as new-onset psychiatric diagnosis is unusual for an elderly  Patient Plan admit medical surgical floor  Diagnosis  #1 acute delirium #2 hyponatremia  #3 anemia  CRITICAL CARE Performed by: Doug Sou   Total critical care time: 30 minute  Critical care time  was exclusive of separately billable procedures and treating other patients.  Critical care was necessary to treat or prevent imminent or life-threatening deterioration.  Critical care was time spent personally by me on the following activities: development of treatment plan with patient and/or surrogate as well as nursing, discussions with consultants, evaluation of patient's response to treatment, examination of patient, obtaining history from patient or surrogate, ordering and performing treatments and interventions, ordering and review of laboratory studies, ordering and review of radiographic studies, pulse oximetry and re-evaluation of patient's condition.  Doug Sou, MD 09/23/11 1653  Doug Sou, MD 09/23/11 [phone]

## 2011-09-23 NOTE — ED Notes (Signed)
Pt from home accompanied by 3 sons and wife who report that pt began cursing and "talking out of his head" at approximately 1130 today. Pt's son Marcial Pacas) reports that pt had similar episode in March 2013 when he became disoriented, was taken to [facility] with a psych workup done. Pt has also seen a Neurologist with MRI and other results to be reviewed 5/31. Pt remains agitated and disoriented to self, place and time. Pt continuously repeating that he wants help and "God forgive me"  but he does not want [place] to touch him as well as cursing.

## 2011-09-24 ENCOUNTER — Inpatient Hospital Stay (HOSPITAL_COMMUNITY): Payer: BC Managed Care – PPO

## 2011-09-24 ENCOUNTER — Encounter (HOSPITAL_COMMUNITY): Payer: Self-pay | Admitting: *Deleted

## 2011-09-24 DIAGNOSIS — R404 Transient alteration of awareness: Secondary | ICD-10-CM

## 2011-09-24 DIAGNOSIS — R0602 Shortness of breath: Secondary | ICD-10-CM

## 2011-09-24 DIAGNOSIS — R52 Pain, unspecified: Secondary | ICD-10-CM

## 2011-09-24 LAB — COMPREHENSIVE METABOLIC PANEL
ALT: 129 U/L — ABNORMAL HIGH (ref 0–53)
AST: 66 U/L — ABNORMAL HIGH (ref 0–37)
Albumin: 3.3 g/dL — ABNORMAL LOW (ref 3.5–5.2)
Alkaline Phosphatase: 98 U/L (ref 39–117)
BUN: 14 mg/dL (ref 6–23)
CO2: 26 mEq/L (ref 19–32)
Calcium: 8.3 mg/dL — ABNORMAL LOW (ref 8.4–10.5)
Chloride: 100 mEq/L (ref 96–112)
Creatinine, Ser: 1.05 mg/dL (ref 0.50–1.35)
GFR calc Af Amer: 87 mL/min — ABNORMAL LOW (ref >90)
GFR calc non Af Amer: 75 mL/min — ABNORMAL LOW (ref >90)
Glucose, Bld: 98 mg/dL (ref 70–99)
Potassium: 3.8 mEq/L (ref 3.5–5.1)
Sodium: 131 mEq/L — ABNORMAL LOW (ref 135–145)
Total Bilirubin: 0.3 mg/dL (ref 0.3–1.2)
Total Protein: 5.8 g/dL — ABNORMAL LOW (ref 6.0–8.3)

## 2011-09-24 LAB — CBC
HCT: 29.2 % — ABNORMAL LOW (ref 39.0–52.0)
Hemoglobin: 10.5 g/dL — ABNORMAL LOW (ref 13.0–17.0)
MCH: 32.8 pg (ref 26.0–34.0)
MCHC: 36 g/dL (ref 30.0–36.0)
MCV: 91.3 fL (ref 78.0–100.0)
Platelets: 228 10*3/uL (ref 150–400)
RBC: 3.2 MIL/uL — ABNORMAL LOW (ref 4.22–5.81)
RDW: 12.9 % (ref 11.5–15.5)
WBC: 7.1 10*3/uL (ref 4.0–10.5)

## 2011-09-24 LAB — CARDIAC PANEL(CRET KIN+CKTOT+MB+TROPI)
CK, MB: 2.5 ng/mL (ref 0.3–4.0)
CK, MB: 2.3 ng/mL (ref 0.3–4.0)
Relative Index: 1.2 (ref 0.0–2.5)
Relative Index: 1.1 (ref 0.0–2.5)
Total CK: 211 U/L (ref 7–232)
Total CK: 213 U/L (ref 7–232)
Troponin I: <0.3 ng/mL (ref <0.30)
Troponin I: <0.3 ng/mL (ref <0.30)

## 2011-09-24 LAB — HEMOGLOBIN A1C
Hgb A1c MFr Bld: 6 % — ABNORMAL HIGH (ref <5.7)
Mean Plasma Glucose: 126 mg/dL — ABNORMAL HIGH (ref <117)

## 2011-09-24 LAB — GLUCOSE, CAPILLARY: Glucose-Capillary: 93 mg/dL (ref 70–99)

## 2011-09-24 LAB — TSH: TSH: 0.381 u[IU]/mL (ref 0.350–4.500)

## 2011-09-24 MED ORDER — IOHEXOL 300 MG/ML  SOLN
100.0000 mL | Freq: Once | INTRAMUSCULAR | Status: AC | PRN
  Administered 2011-09-24: 100 mL via INTRAVENOUS

## 2011-09-24 NOTE — Progress Notes (Signed)
Per Dr.Devine-okay for patient to go outside to smoke onetime with security-today only.

## 2011-09-24 NOTE — Progress Notes (Signed)
Patient refused scheduled med- hyoscyamine (LEVBID) 0.375 MG 12 hr tablet 0.375 mg at 2200. Patient has not been taking medication since admission date.

## 2011-09-24 NOTE — Progress Notes (Signed)
Patient ID: Jacob Barr, male   DOB: [birth year], 61 y.o.   MRN: [id number]  Assessment/Plan   Active Problems:   Altered mental status  - unclear etiology at this time  - obtained CT head, UDS and ammonia level are all unremarkable  - family initially refused psychiatric consult on rounds that we should first evaluate patient's abdominal pain. Now with normal CT scan findings we will go ahead and request psychiatric evaluation - will admit the pt to medical floor for further evaluation and management  - CE and TSH level are all within normal limits  Transaminitis  - unclear etiology  - acute hepatitis panel negative so far - alcohol level normal  Hyponatremia  - likely of prerenal etiology  - sodium 131 today  Anemia  - appears to be stable at this point   Hyperglycemia  - will check A1C   DVT Prophylaxis - Lovenox   Code Status - Full   Education  - test results and diagnostic studies were discussed with pt's family who was present at the bedside  - family have verbalized the understanding  - questions were answered at the bedside and contact information was provided for additional questions or concerns    Subjective: No events overnight. Patient reports lower abdominal pain today but he also mentions that this a more of a chronic problem and has never been evaluated.  Objective:  Vital signs in last 24 hours:  Filed Vitals:   09/23/11 1900 09/23/11 2117 09/24/11 0527 09/24/11 1400  BP: 106/59 100/60 92/50 111/63  Pulse: 55 71 67 50  Temp: 98 F (36.7 C) 98.8 F (37.1 C) 98.2 F (36.8 C) 98.4 F (36.9 C)  TempSrc: Oral Oral Oral Oral  Resp: 18 16 16 18   Height: 5\' 6"  (1.676 m)     Weight: 63.05 kg (139 lb)     SpO2: 94% 99% 96% 100%    Intake/Output from previous day:   Intake/Output Summary (Last 24 hours) at 09/24/11 1708 Last data filed at 09/24/11 1500  Gross per 24 hour  Intake 5760.4 ml  Output   1995 ml  Net 3765.4 ml    Physical  Exam: General: Alert, awake, oriented x3, in no acute distress. HEENT: No bruits, no goiter. Moist mucous membranes, no scleral icterus, no conjunctival pallor. Heart: Regular rate and rhythm, S1/S2 +, no murmurs, rubs, gallops. Lungs: Clear to auscultation bilaterally. No wheezing, no rhonchi, no rales.  Abdomen: Soft, nontender, nondistended, positive bowel sounds. Extremities: No clubbing or cyanosis, no pitting edema,  positive pedal pulses. Neuro: Grossly nonfocal.  Lab Results:   Lab 09/24/11 0057 09/23/11 1530 09/23/11 1435  WBC 7.1 7.4 8.0  HGB 10.5* 10.3* 12.3*  HCT 29.2* 28.9* 34.6*  PLT 228 228 332  MCV 91.3 91.2 90.3  MCH 32.8 32.5 32.1  MCHC 36.0 35.6 35.5  RDW 12.9 12.7 12.7  LYMPHSABS -- 1.0 --  MONOABS -- 0.5 --  EOSABS -- 0.0 --  BASOSABS -- 0.0 --  BANDABS -- -- --    Lab 09/24/11 0057 09/23/11 1818 09/23/11 1530 09/23/11 1435  NA 131* -- 126* 128*  K 3.8 -- 3.5 3.8  CL 100 -- 93* 93*  CO2 26 -- 22 21  GLUCOSE 98 -- 197* 194*  BUN 14 -- 19 18  CREATININE 1.05 -- 1.01 1.03  CALCIUM 8.3* -- 8.6 9.0  MG -- 1.9 -- --    Lab 09/23/11 1818  INR 0.98  PROTIME --  Cardiac markers:  Lab 09/24/11 0900 09/24/11 0057 09/23/11 1818  CKMB 2.3 2.5 2.3  TROPONINI <0.30 <0.30 <0.30  MYOGLOBIN -- -- --   No components found with this basename: POCBNP:3 No results found for this or any previous visit (from the past 240 hour(s)).  Studies/Results: Ct Head Wo Contrast 09/23/2011  *RADIOLOGY REPORT*  Clinical Data: Altered mental status  CT HEAD WITHOUT CONTRAST  Technique:  Contiguous axial images were obtained from the base of the skull through the vertex without contrast.  Comparison: 08/23/2011  Findings: Ventricle size is normal.  Negative for intracranial hemorrhage.  No infarct or mass is present.  Calvarium is intact. No change from prior study.  IMPRESSION: No significant abnormality.  Original Report Authenticated By: Camelia Phenes, M.D.   Ct Abdomen  Pelvis W Contrast 09/24/2011  *RADIOLOGY REPORT*  Clinical Data: 61 year old male with left-sided abdominal and pelvic pain.  CT ABDOMEN AND PELVIS WITH CONTRAST  Technique:  Multidetector CT imaging of the abdomen and pelvis was performed following the standard protocol during bolus administration of intravenous contrast.  Contrast: OMNIPAQUE IOHEXOL 300 MG/ML  SOLN  Comparison: 06/27/2011 CT  Findings: The liver, gallbladder, spleen, pancreas, adrenal glands and kidneys are unremarkable.  No free fluid, enlarged lymph nodes, biliary dilation or abdominal aortic aneurysm identified.  The bowel, appendix and bladder are unremarkable. No acute or suspicious bony abnormalities are identified.  IMPRESSION: No acute or significant abnormalities identified.  Original Report Authenticated By: Rosendo Gros, M.D.   Dg Chest Port 1 View 09/23/2011  *RADIOLOGY REPORT*  Clinical Data: Shortness of breath.  PORTABLE CHEST - 1 VIEW  Comparison: 08/12/2011  Findings: The cardiomediastinal silhouette is unremarkable. COPD/emphysema again noted. There is no evidence of focal airspace disease, pulmonary edema, suspicious pulmonary nodule/mass, pleural effusion, or pneumothorax. No acute bony abnormalities are identified.  IMPRESSION: No evidence of acute cardiopulmonary disease.  COPD/emphysema.  Original Report Authenticated By: Rosendo Gros, M.D.    Medications: Scheduled Meds:   . aspirin EC  81 mg Oral Daily  . clonazePAM  0.5 mg Oral QHS  . enoxaparin (LOVENOX) injection  40 mg Subcutaneous Q24H  . hyoscyamine  0.375 mg Oral BID  . nicotine  21 mg Transdermal Daily  . sertraline  50 mg Oral Daily  . DISCONTD: enoxaparin  30 mg Subcutaneous Q24H   Continuous Infusions:   . sodium chloride 1,000 mL (09/24/11 1221)   PRN Meds:.iohexol    LOS: 1 day   Liliyana Thobe 09/24/2011, 5:08 PM  [facility] Pager: [phone]

## 2011-09-25 ENCOUNTER — Inpatient Hospital Stay (HOSPITAL_COMMUNITY): Payer: BC Managed Care – PPO

## 2011-09-25 DIAGNOSIS — R404 Transient alteration of awareness: Secondary | ICD-10-CM

## 2011-09-25 DIAGNOSIS — R52 Pain, unspecified: Secondary | ICD-10-CM

## 2011-09-25 DIAGNOSIS — R0602 Shortness of breath: Secondary | ICD-10-CM

## 2011-09-25 LAB — HEPATITIS PANEL, ACUTE
HCV Ab: NEGATIVE
Hep A IgM: NEGATIVE
Hep B C IgM: NEGATIVE
Hepatitis B Surface Ag: NEGATIVE

## 2011-09-25 LAB — GLUCOSE, CAPILLARY: Glucose-Capillary: 90 mg/dL (ref 70–99)

## 2011-09-25 MED ORDER — METHYLPREDNISOLONE SODIUM SUCC 125 MG IJ SOLR
125.0000 mg | Freq: Once | INTRAMUSCULAR | Status: DC
  Filled 2011-09-25: qty 2

## 2011-09-25 MED ORDER — SERTRALINE HCL 50 MG PO TABS: 25.0000 mg | ORAL_TABLET | Freq: Every day | ORAL | Status: DC

## 2011-09-25 MED ORDER — PREDNISONE (PAK) 10 MG PO TABS: 50.0000 mg | ORAL_TABLET | Freq: Every day | ORAL | Status: AC

## 2011-09-25 MED ORDER — METHYLPREDNISOLONE SODIUM SUCC 125 MG IJ SOLR
125.0000 mg | INTRAMUSCULAR | Status: AC
  Administered 2011-09-25: 125 mg via INTRAVENOUS
  Filled 2011-09-25: qty 2

## 2011-09-25 NOTE — Progress Notes (Signed)
UR completed 

## 2011-09-25 NOTE — Discharge Summary (Signed)
Patient ID: Jacob Barr MRN: [id number] DOB/AGE: 1951-02-07 61 y.o.  Admit date: 09/23/2011 Discharge date: 09/25/2011  Primary Care Physician:  Kaleen Mask, MD, MD  Assessment/Plan   Active Problems:   Altered mental status  - unclear etiology at this time  - obtained CT head, UDS, alcohol and ammonia level and are all unremarkable  - CE and TSH level are all within normal limits  - at this time patient has no suicidal ideations or homicidal ideations, no signs of depression or agitation; patient is alert, oriented to time, place and person; the reason why sitter order was placed in ED because patient was angry and wanted to hit ED physician but he has not displaced aggressive behavior since then.  - MRI lumbar spine completed and shows results of disk herniation and bulgin; per neurosurgery we will start solumedrol 125 mg IV once and then prednisone pack and patient to follow up with neurosurgery Thursday 09/28/2011 - Taper zoloft  25 mg for 1-2 weeks and then 25 mg every other day and stop after 2 weeks as patient does not tolerate this medication and it may cause him to act aggresively  Abdominal pain - obtained CT abdomen/pelivs with no significant findings  Transaminitis  - unclear etiology  - acute hepatitis panel negative so far  - alcohol level normal  - patient reported he stopped drinking alcohol few years ago - told patient to follow up with PCP to recheck LFTs  Hyponatremia  - likely of prerenal etiology  - sodium 131 today   Anemia  - appears to be stable at this point   Hyperglycemia  - A1c 6.0 - CBG controlled while in hospital; 90  DVT Prophylaxis - Lovenox while in hospital  Code Status - Full   Education  - test results and diagnostic studies were discussed with pt's family who was present at the bedside  - family have verbalized the understanding  - questions were answered at the bedside and contact information was provided for  additional questions or concerns   Disposition - I have had a lengthy discussion with the family about patient's labs, diagnostic studies and have reassured them that the results are not pointing towards the etiology of patient's aggravated behavior and/or mood swing.  I have also called neurology Dr. Glenna Durand per family request who reported that the patient has appointment 10/06/2011 and is scheduled to follow up in regards to recent MRI brain which did not show any significant findings.  Consults: 1. Neurosurgery phone call with recommendations as above    Medication List  As of 09/25/2011  2:55 PM   ASK your doctor about these medications         aspirin EC 81 MG tablet   Take 81 mg by mouth daily.      clonazePAM 0.5 MG tablet   Commonly known as: KLONOPIN   Take 0.5 mg by mouth at bedtime as needed. sleep      hyoscyamine 0.375 MG 12 hr tablet   Commonly known as: LEVBID   Take 0.375 mg by mouth 2 (two) times daily. Stomach spasms      sertraline 50 MG tablet   Commonly known as: ZOLOFT   Take 50 mg by mouth daily.           Significant Diagnostic Studies:  Ct Head Wo Contrast 09/23/2011   IMPRESSION: No significant abnormality.    Ct Abdomen Pelvis W Contrast 09/24/2011   IMPRESSION: No acute or significant abnormalities identified.  Dg Chest Port 1 View 09/23/2011    IMPRESSION: No evidence of acute cardiopulmonary disease.  COPD/emphysema.    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 Left foraminal to extra foraminal disc herniation could effect the left L3 nerve root. L5-S1: Annular tearing annular bulging. No apparent neural compression.   Brief H and P: 61 year old male who presented to [facility] ED with main concern of increasing combativeness and agitation, cursing over the past 3 days prior to admission.  Per patient he is aware of these episodes of agitation and reported this is because he is in a lot of pain specifically left lower quadrant  abdominal pain and no one addresses this concern. On this admission we have obtained CT abdomen pelvis and MRI lumbar spine to evaluate his concerns and findings are indicated above.  Physical Exam on Discharge:  Filed Vitals:   09/24/11 1400 09/24/11 2101 09/25/11 0234 09/25/11 0546  BP: 111/63 108/64 115/64 123/73  Pulse: 50 51 60 51  Temp: 98.4 F (36.9 C) 97.9 F (36.6 C)  98 F (36.7 C)  TempSrc: Oral Oral  Oral  Resp: 18 16 16 15   Height:      Weight:    63.1 kg (139 lb 1.8 oz)  SpO2: 100% 100%  100%     Intake/Output Summary (Last 24 hours) at 09/25/11 1455 Last data filed at 09/25/11 1250  Gross per 24 hour  Intake 4560.4 ml  Output      0 ml  Net 4560.4 ml    General: Alert, awake, oriented x3, in no acute distress. HEENT: No bruits, no goiter. Heart: Regular rate and rhythm, without murmurs, rubs, gallops. Lungs: Clear to auscultation bilaterally. Abdomen: Soft, nontender, nondistended, positive bowel sounds. Extremities: No clubbing cyanosis or edema with positive pedal pulses. Neuro: Grossly intact, nonfocal.  CBC:    Component Value Date/Time   WBC 7.1 09/24/2011 0057   HGB 10.5* 09/24/2011 0057   HCT 29.2* 09/24/2011 0057   PLT 228 09/24/2011 0057   MCV 91.3 09/24/2011 0057   NEUTROABS 5.9 09/23/2011 1530   LYMPHSABS 1.0 09/23/2011 1530   MONOABS 0.5 09/23/2011 1530   EOSABS 0.0 09/23/2011 1530   BASOSABS 0.0 09/23/2011 1530    Basic Metabolic Panel:    Component Value Date/Time   NA 131* 09/24/2011 0057   K 3.8 09/24/2011 0057   CL 100 09/24/2011 0057   CO2 26 09/24/2011 0057   BUN 14 09/24/2011 0057   CREATININE 1.05 09/24/2011 0057   GLUCOSE 98 09/24/2011 0057   CALCIUM 8.3* 09/24/2011 0057    Time spent on Discharge: 65 minutes  Signed: Anjali Manzella 09/25/2011, 2:55 PM

## 2011-09-25 NOTE — Progress Notes (Signed)
Clinical Social Work Department CLINICAL SOCIAL WORK PSYCHIATRY SERVICE LINE ASSESSMENT 09/25/2011  Patient:  NATION CRADLE  Account:  [id number]  Admit Date:  09/23/2011  Clinical Social Worker:  Doroteo Glassman  Date/Time:  09/25/2011 03:44 PM Referred by:  Physician  Date referred:  09/25/2011 Reason for Referral  Behavioral Health Issues   Presenting Symptoms/Problems (In the person's/family's own words):   Pt presented to the hospital with altered mental status of unknown origin.    Abuse/Neglect/Trauma Comments:    Psychiatric medications:  Zoloft, Rx'd by a neurologist.   Current Mental Health Hospitalizations/Previous Mental Health History:   Current provider:   Place and Date:   Current Medications:   See H&P   Previous Impatient Admission/Date/Reason:   Emotional Health / Current Symptoms    Suicide/Self Harm  None reported   Suicide attempt in the past:   Pt reports having passive suicidal ideations, with last incident "years ago."   Other harmful behavior:    Other Psychotic/Dissociative Symptoms:     Other Attention / Behavioral Symptoms:    Cognitive Impairment  Other - See comment   Other Cognitive Impairment:   Pt presented to the hospital due to anger and aggression. Pt, per familiy, was angry, cursing, pushing objects off of the table in anger and was confused.    Mood and Adjustment  Anxious    Stress, Anxiety, Trauma, Any Recent Loss/Stressor  Anxiety   Anxiety (frequency):   Pt frustrated over the inability to engage in the types of activities he used to prior to retirement in the fall.   Phobia (specify):   Compulsive behavior (specify):   Obsessive behavior (specify):   Other:   Substance Abuse/Use  History of substance use   SBIRT completed (please refer for detailed history):    Self-reported substance use:   "Years ago."   Urinary Drug Screen Completed:   Alcohol level:    Environmental/Housing/Living  Arrangement  Stable housing   Who is in the home:   Wife   Emergency contact:  Wife   Personnel officer   Patient's Strengths and Goals (patient's own words):   Strong familial support   Clinical Social Worker's Interpretive Summary:   Pt was calm, cooperative and engaging.  Pt denies current SI, HI, AVH.  Pt reports that he came in from working in the yard and that he doesn't remember what happened that precipitated him being hospitalized.  Per sons, Pt became angry for no apparent reason and was cussing, throwing things off of the table and had to be restrained.  Pt doesn't remember this event.  It does not appear that Pt needs outpt tx, however he may benefit from med mgmt, as he admits to depression related to his inability to engage in activities that he used to.   Disposition:  Psych Clinical Social Worker signing off

## 2011-09-25 NOTE — Discharge Instructions (Signed)
Herniated Disk Disks are the soft tissue cushions that are found between the bones of the spine. When a disk herniates, a part of the material inside the disk pushes out and may cause pressure against the nerves near the spine. If this happens in the neck this results in pain in the neck, shoulder and down the arm. In the lower back a ruptured disk causes pain in the back, buttock and down the back of leg (sciatica). Numbness and weakness in the arms or legs may also be signs of a ruptured disk. Disk herniation is very common. Only a small number of patients with disk problems need more treatment than bed rest and pain medicine for relief. Pressures on the disks of the lower back are much higher in the sitting position. It is important that you avoid prolonged sitting until the pain and back spasms improve. Lying on your side is the preferred position. You should rest or sleep on a firm mattress (use a sheet of plywood under the mattress if necessary). Water beds do not provide enough support. You should avoid bending, lifting or any other activity that increases your pain. Traction applied to the neck or back may help reduce symptoms. Special braces may also be beneficial. When your pain improves, you should resume normal activity gradually. Take periods of rest throughout the day. Only take over-the-counter or prescription medicines for pain, discomfort or fever as directed by your caregiver. Cold packs applied for 30 minutes every 2-3 hours may also help relieve pain or spasms. Exercises to strengthen your back and abdominal muscles and to improve your fitness may be prescribed as symptoms improve. Call your doctor for a follow-up appointment as recommended.  SEEK IMMEDIATE MEDICAL CARE IF:  You have severe pain, increased weakness or numbness, or lose control of your bladder or bowels. Document Released: 06/01/2004 Document Revised: 04/13/2011 Document Reviewed: 04/24/2005 Exit[employer] Patient Information  2012 [employer]. 

## 2011-10-20 ENCOUNTER — Other Ambulatory Visit: Payer: Self-pay | Admitting: Neurosurgery

## 2011-10-20 DIAGNOSIS — M546 Pain in thoracic spine: Secondary | ICD-10-CM

## 2013-10-25 ENCOUNTER — Encounter (HOSPITAL_COMMUNITY): Payer: Self-pay | Admitting: Emergency Medicine

## 2013-10-25 ENCOUNTER — Emergency Department (HOSPITAL_COMMUNITY)
Admission: EM | Admit: 2013-10-25 | Discharge: 2013-10-25 | Disposition: A | Discharge status: Home / Self Care | Payer: BC Managed Care – PPO | Source: Home / Self Care | Attending: Family Medicine | Admitting: Family Medicine

## 2013-10-25 DIAGNOSIS — J441 Chronic obstructive pulmonary disease with (acute) exacerbation: Secondary | ICD-10-CM

## 2013-10-25 MED ORDER — AZITHROMYCIN 250 MG PO TABS: 250.0000 mg | ORAL_TABLET | Freq: Every day | ORAL | Status: DC

## 2013-10-25 MED ORDER — ALBUTEROL SULFATE HFA 108 (90 BASE) MCG/ACT IN AERS: 2.0000 | INHALATION_SPRAY | Freq: Four times a day (QID) | RESPIRATORY_TRACT | Status: DC | PRN

## 2013-10-25 MED ORDER — PREDNISONE 50 MG PO TABS: 50.0000 mg | ORAL_TABLET | Freq: Every day | ORAL | Status: DC

## 2013-10-25 MED ORDER — IPRATROPIUM-ALBUTEROL 0.5-2.5 (3) MG/3ML IN SOLN
RESPIRATORY_TRACT | Status: AC
  Filled 2013-10-25: qty 3

## 2013-10-25 MED ORDER — IPRATROPIUM-ALBUTEROL 0.5-2.5 (3) MG/3ML IN SOLN
3.0000 mL | Freq: Once | RESPIRATORY_TRACT | Status: AC
  Administered 2013-10-25: 3 mL via RESPIRATORY_TRACT

## 2013-10-25 NOTE — Discharge Instructions (Signed)
Thank you for coming in today. Chronic Obstructive Pulmonary Disease Exacerbation Chronic obstructive pulmonary disease (COPD) is a common lung condition in which airflow from the lungs is limited. COPD is a general term that can be used to describe many different lung problems that limit airflow, including chronic bronchitis and emphysema. COPD exacerbations are episodes when breathing symptoms become much worse and require extra treatment. Without treatment, COPD exacerbations can be life threatening, and frequent COPD exacerbations can cause further damage to your lungs. CAUSES   Respiratory infections.   Exposure to smoke.   Exposure to air pollution, chemical fumes, or dust. Sometimes there is no apparent cause or trigger. RISK FACTORS  Smoking cigarettes.  Older age.  Frequent prior COPD exacerbations. SIGNS AND SYMPTOMS   Increased coughing.   Increased thick spit (sputum) production.   Increased wheezing.   Increased shortness of breath.   Rapid breathing.   Chest tightness. DIAGNOSIS  Your medical history, a physical exam, and tests will help your health care provider make a diagnosis. Tests may include:  A chest X-ray.  Basic lab tests.  Sputum testing.  An arterial blood gas test. TREATMENT  Depending on the severity of your COPD exacerbation, you may need to be admitted to a hospital for treatment. Some of the treatments commonly used to treat COPD exacerbations are:   Antibiotic medicines.   Bronchodilators. These are drugs that expand the air passages. They may be given with an inhaler or nebulizer. Spacer devices may be needed to help improve drug delivery.  Corticosteroid medicines.  Supplemental oxygen therapy.  HOME CARE INSTRUCTIONS   Do not smoke. Quitting smoking is very important to prevent COPD from getting worse and exacerbations from happening as often.  Avoid exposure to all substances that irritate the airway, especially to  tobacco smoke.   If prescribed, take your antibiotics as directed. Finish them even if you start to feel better.  Only take over-the-counter or prescription medicines as directed by your health care provider.It is important to use correct technique with inhaled medicines.  Drink enough fluids to keep your urine clear or pale yellow (unless you have a medical condition that requires fluid restriction).  Use a cool mist vaporizer. This makes it easier to clear your chest when you cough.   If you have a home nebulizer and oxygen, continue to use them as directed.   Maintain all necessary vaccinations to prevent infections.   Exercise regularly.   Eat a healthy diet.   Keep all follow-up appointments as directed by your health care provider. SEEK IMMEDIATE MEDICAL CARE IF:  You have worsening shortness of breath.   You have trouble talking.   You have severe chest pain.  You have blood in your sputum.  You have a fever.  You have weakness, vomit repeatedly, or faint.   You feel confused.   You continue to get worse. MAKE SURE YOU:   Understand these instructions.  Will watch your condition.  Will get help right away if you are not doing well or get worse. Document Released: 02/19/2007 Document Revised: 02/12/2013 Document Reviewed: 12/27/2012 [facility] Patient Information 2015 [employer], [place]. This information is not intended to replace advice given to you by your health care provider. Make sure you discuss any questions you have with your health care provider.

## 2013-10-25 NOTE — ED Notes (Signed)
Patient states that he has had a productive cough for past 2 to 3 weeks; states congestion with fever and chills. Denies nausea/vomiting.

## 2013-10-25 NOTE — ED Provider Notes (Signed)
Jacob Barr is a 63 y.o. male who presents to Urgent Care today for cough. Patient notes productive cough associated with wheezing chills and mild shortness of breath. No nausea vomiting diarrhea or fevers. Patient has a history of COPD but does not take any medications. He tried Tylenol which did not help. Symptoms have been present for 2-3 weeks. No chest pains or palpitations.   Past Medical History  Diagnosis Date  . Emphysema   . Tobacco abuse   . COPD (chronic obstructive pulmonary disease)   . Depression    History  Substance Use Topics  . Smoking status: Current Every Day Smoker -- 1.50 packs/day for 50 years    Types: Cigarettes  . Smokeless tobacco: Never Used  . Alcohol Use: No   ROS as above Medications: No current facility-administered medications for this encounter.   Current Outpatient Prescriptions  Medication Sig Dispense Refill  . albuterol (PROVENTIL HFA;VENTOLIN HFA) 108 (90 BASE) MCG/ACT inhaler Inhale 2 puffs into the lungs every 6 (six) hours as needed for wheezing or shortness of breath.  1 Inhaler  2  . aspirin EC 81 MG tablet Take 81 mg by mouth daily.      Marland Kitchen. azithromycin (ZITHROMAX) 250 MG tablet Take 1 tablet (250 mg total) by mouth daily. Take first 2 tablets together, then 1 every day until finished.  6 tablet  0  . clonazePAM (KLONOPIN) 0.5 MG tablet Take 0.5 mg by mouth at bedtime as needed. sleep      . hyoscyamine (LEVBID) 0.375 MG 12 hr tablet Take 0.375 mg by mouth 2 (two) times daily. Stomach spasms      . predniSONE (DELTASONE) 50 MG tablet Take 1 tablet (50 mg total) by mouth daily.  5 tablet  0  . sertraline (ZOLOFT) 50 MG tablet Take 0.5 tablets (25 mg total) by mouth daily.  45 tablet  0    Exam:  BP 142/84  Pulse 72  Temp(Src) 98.2 F (36.8 C) (Oral)  Resp 14  SpO2 99% Gen: Well NAD HEENT: EOMI,  MMM posterior pharynx cobblestoning. Tympanic membranes are normal appearing bilaterally. Lungs: Normal work of breathing. Expiratory  wheezing present bilaterally. Heart: RRR no MRG Abd: NABS, Soft. NT, ND Exts: Brisk capillary refill, warm and well perfused.   Patient received a DuoNeb nebulizer treatment and improvement in symptoms  No results found for this or any previous visit (from the past 24 hour(s)). No results found.  Assessment and Plan: 63 y.o. male with COPD exacerbation. Plan to treat with prednisone and azithromycin and albuterol.  Discussed warning signs or symptoms. Please see discharge instructions. Patient expresses understanding.    Rodolph BongEvan S Corey, MD 10/25/13 1255

## 2014-02-10 IMAGING — US US ABDOMEN COMPLETE
1 series · 14 of 25 positions shown · non-contrast
Comparison: CT abdomen pelvis dated 06/27/2011

CLINICAL DATA: Abnormal LFTs, acute kidney injury

COMPLETE ABDOMINAL ULTRASOUND

[Series 1: us abdomen complete · 0.25mm/px · 14 of 73 slices shown]
[im 1/73]
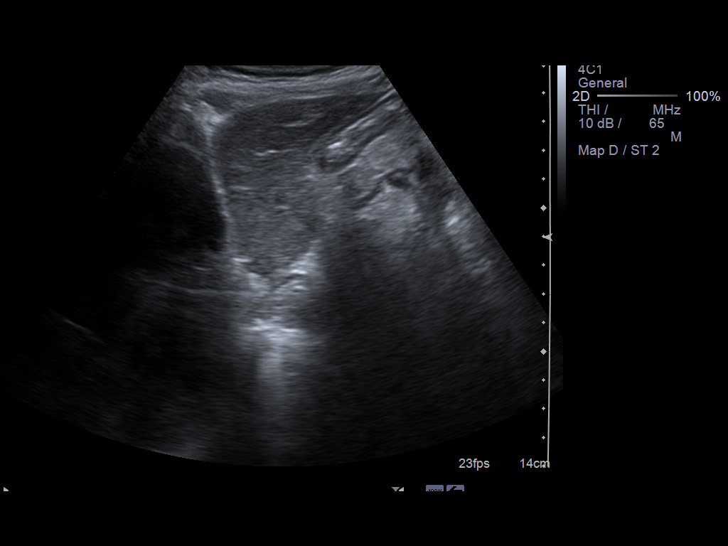
[im 7/73]
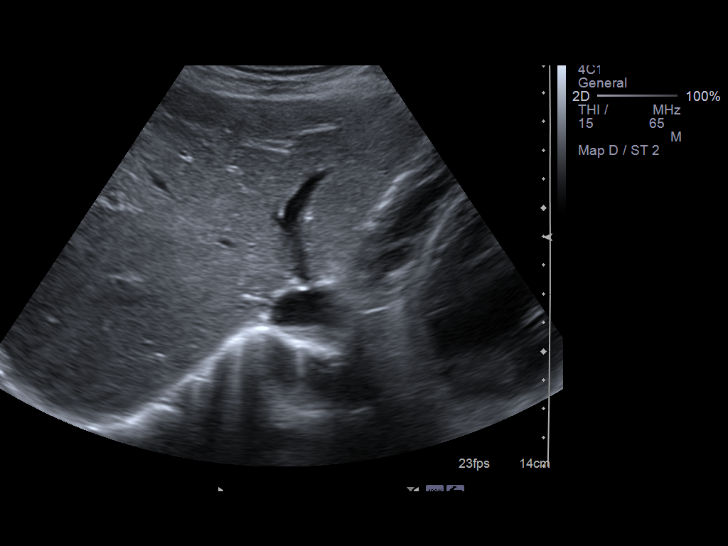
[im 13/73]
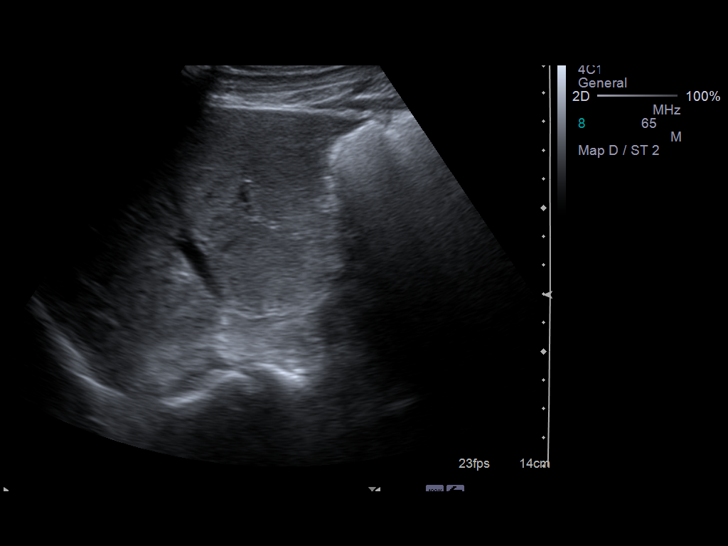
[im 19/73]
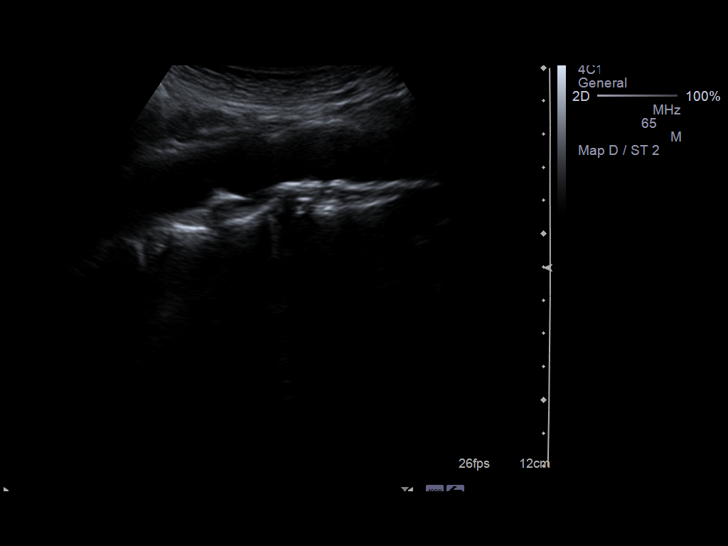
[im 25/73]
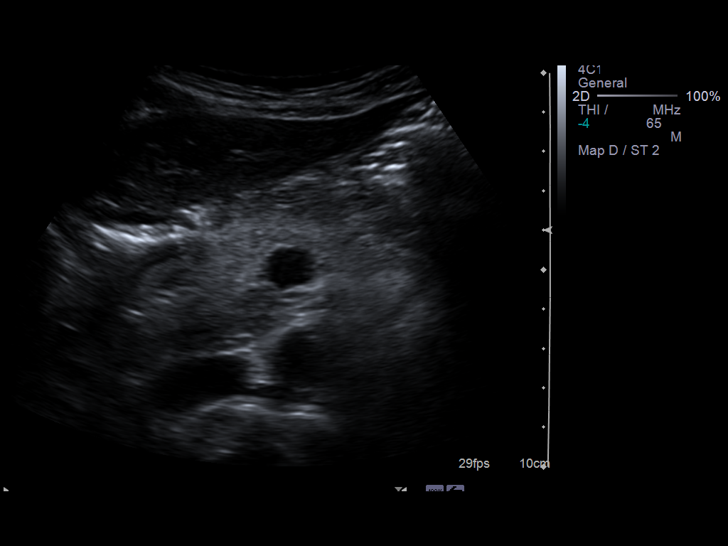
[im 28/73]
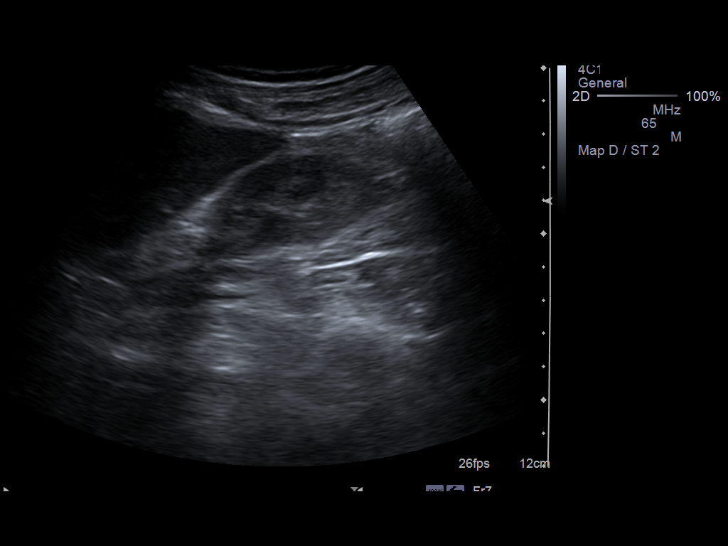
[im 34/73]
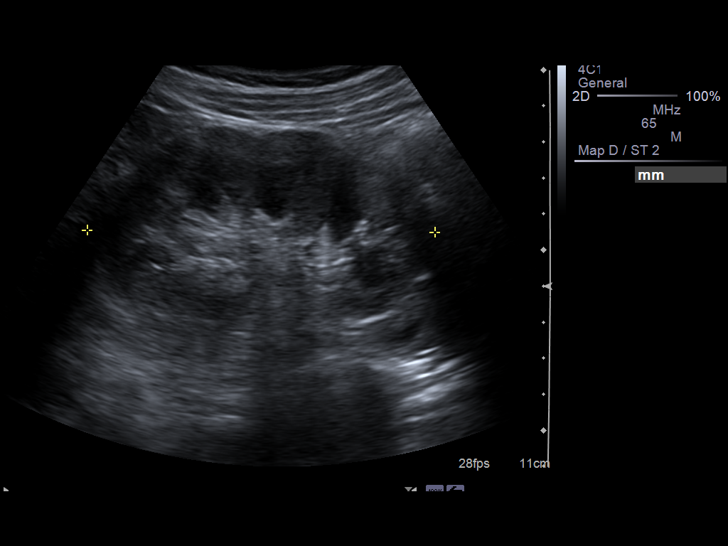
[im 40/73]
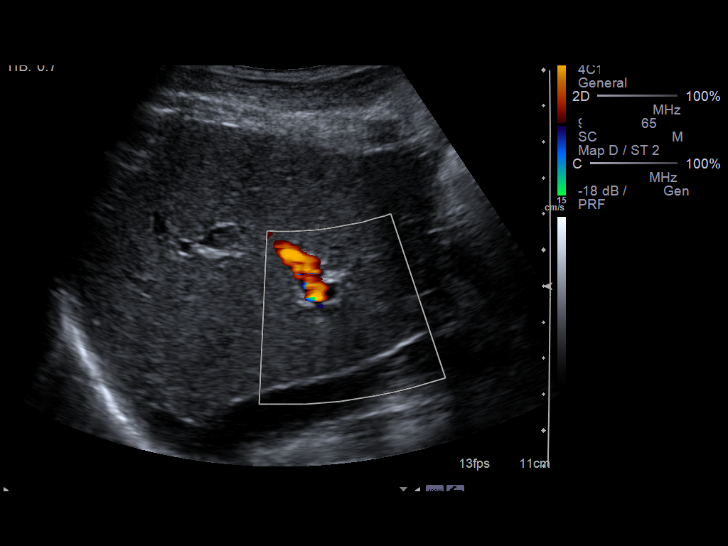
[im 46/73]
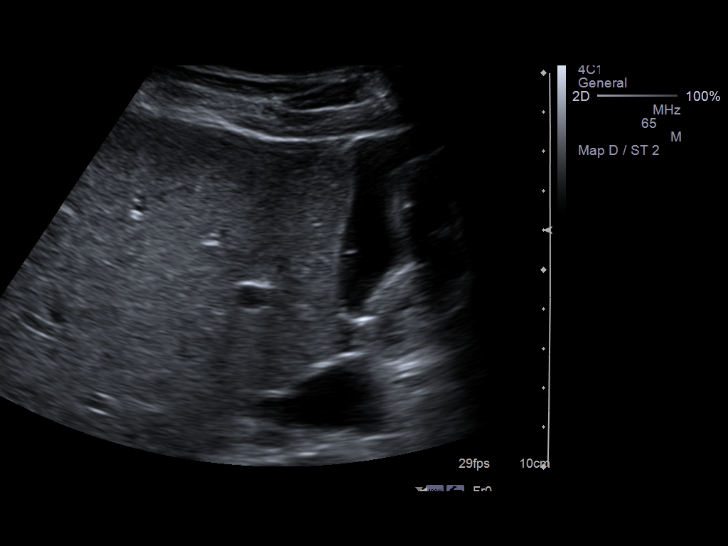
[im 49/73]
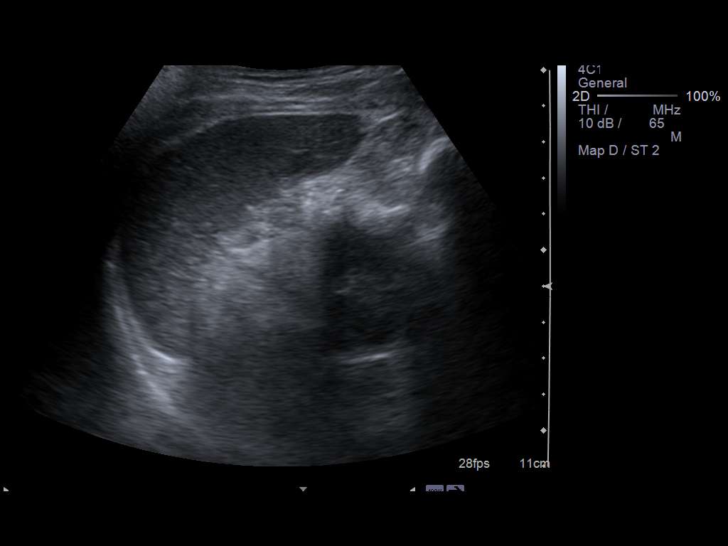
[im 55/73]
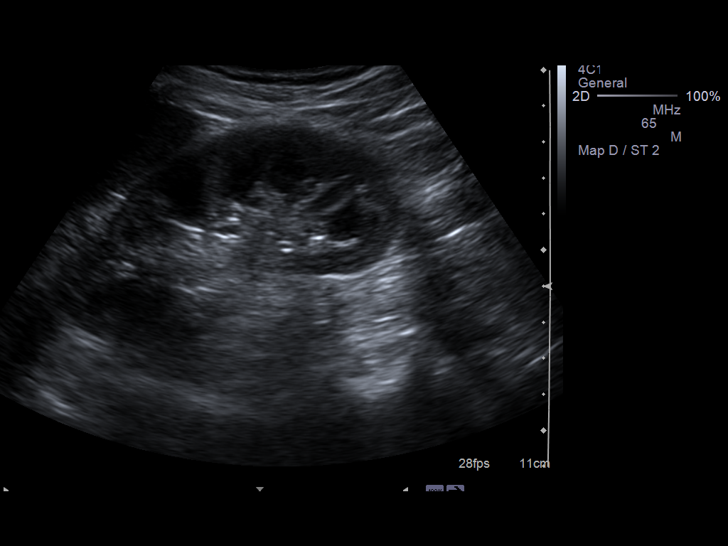
[im 61/73]
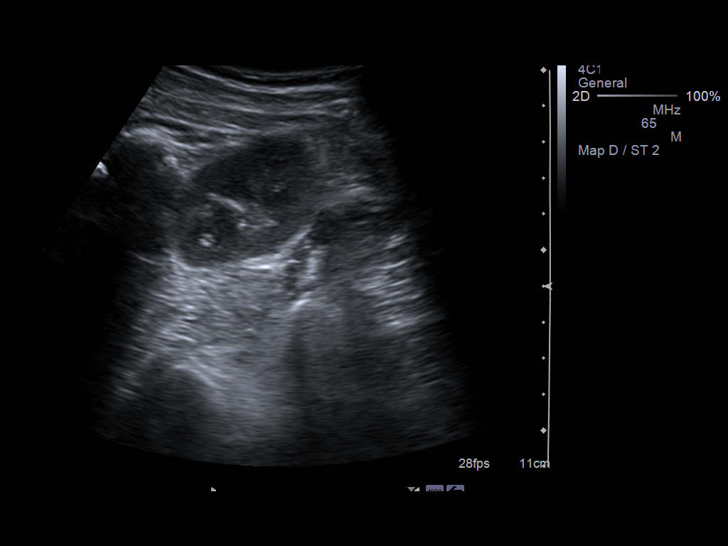
[im 67/73]
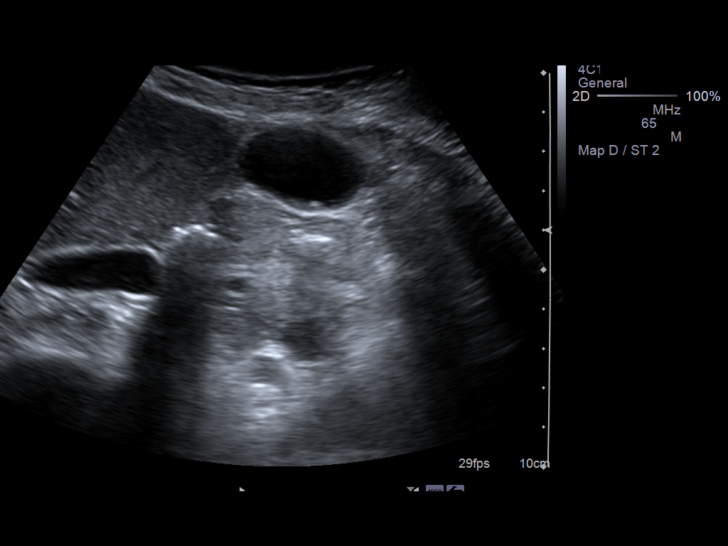
[im 73/73]
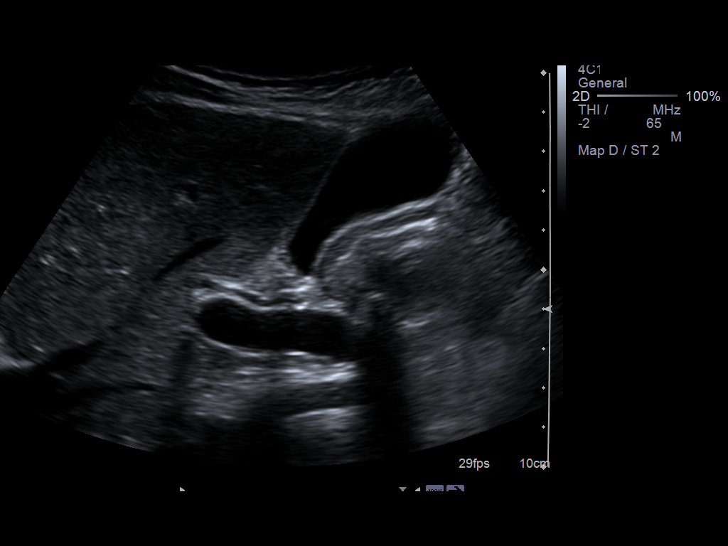

[14 of 25 positions shown; findings below may reference images not displayed]

FINDINGS: Gallbladder:  Tiny 3 mm polyp versus nonshadowing gallstone.  No
pericholecystic fluid or gallbladder wall thickening.  Negative
sonographic Murphy's sign.

Common bile duct:  Measures 3 mm.

Liver:  No focal lesion identified.  Within normal limits in
parenchymal echogenicity.

IVC:  Appears normal.

Pancreas:  Incompletely visualized but grossly unremarkable.

Spleen:  Measures 5.9 cm.

Right Kidney:  Measures 9.8 cm.  No mass or hydronephrosis.

Left Kidney:  Measures 9.9 cm.  No mass or hydronephrosis.

Abdominal aorta:  No aneurysm identified.  Atherosclerosis.
IMPRESSION: 3 mm polyp versus nonshadowing gallstone.  No associated findings
to suggest acute cholecystitis.

No hydronephrosis.

## 2014-03-27 IMAGING — CR DG ABDOMEN ACUTE W/ 1V CHEST
3 series · 3 of 3 positions shown · non-contrast
Comparison: Chest radiograph performed 08/12/2011, CT of the
abdomen and pelvis performed 06/27/2011, and abdominal ultrasound
performed 07/03/2011

CLINICAL DATA: Left lower quadrant abdominal pain for several
months.

ACUTE ABDOMEN SERIES (ABDOMEN 2 VIEW & CHEST 1 VIEW)

[w chest pa]
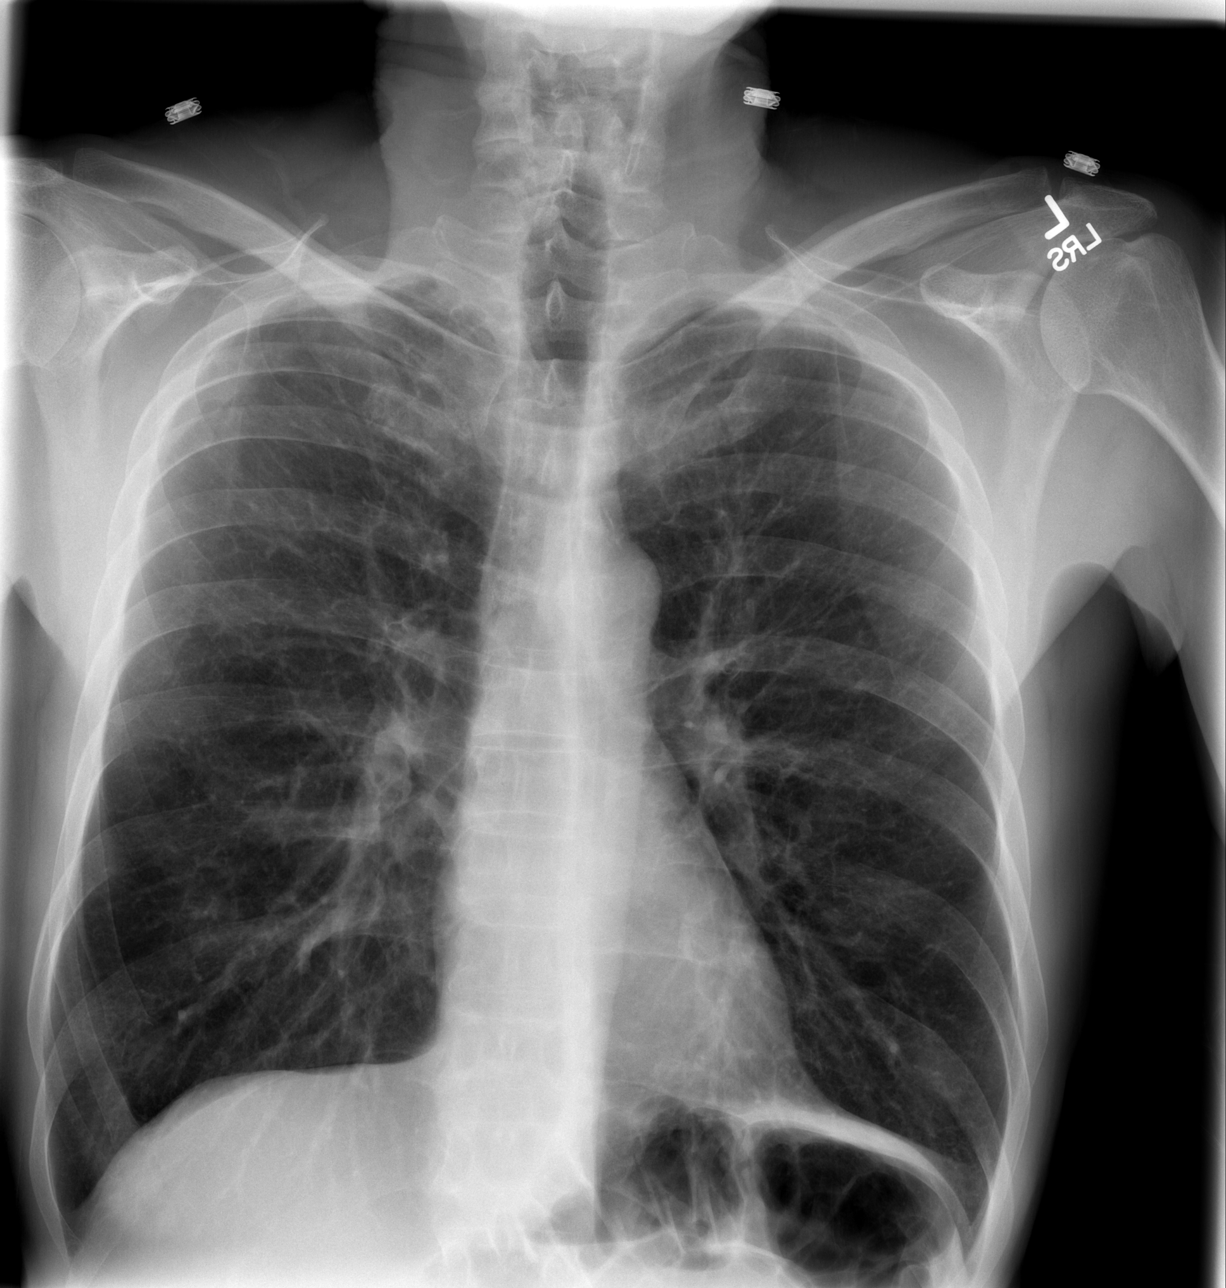

[w abdomen upright]
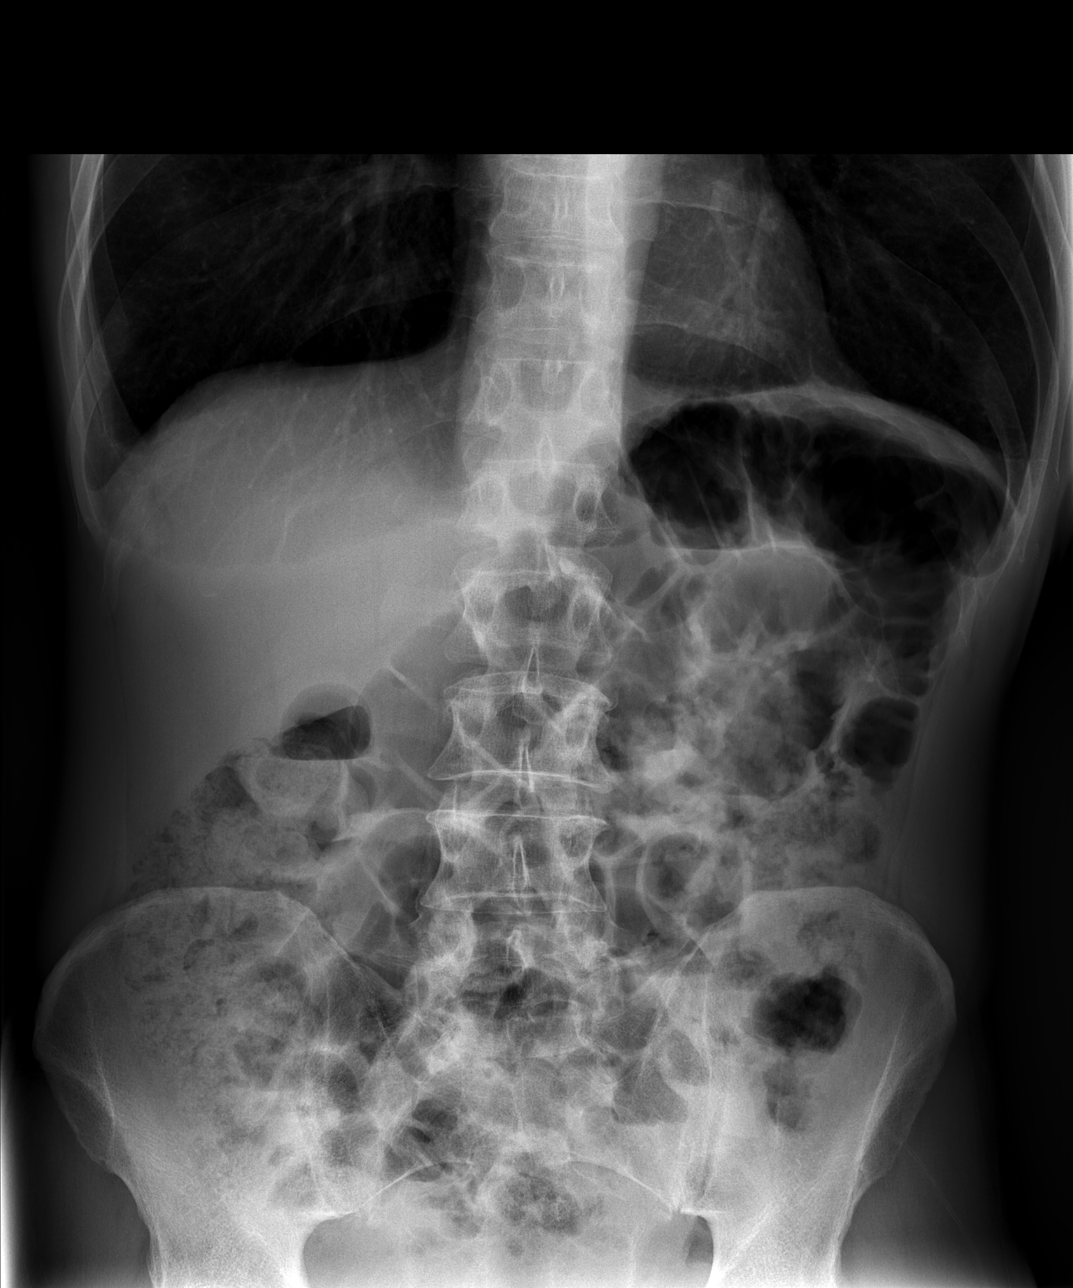

[t abdomen supine]
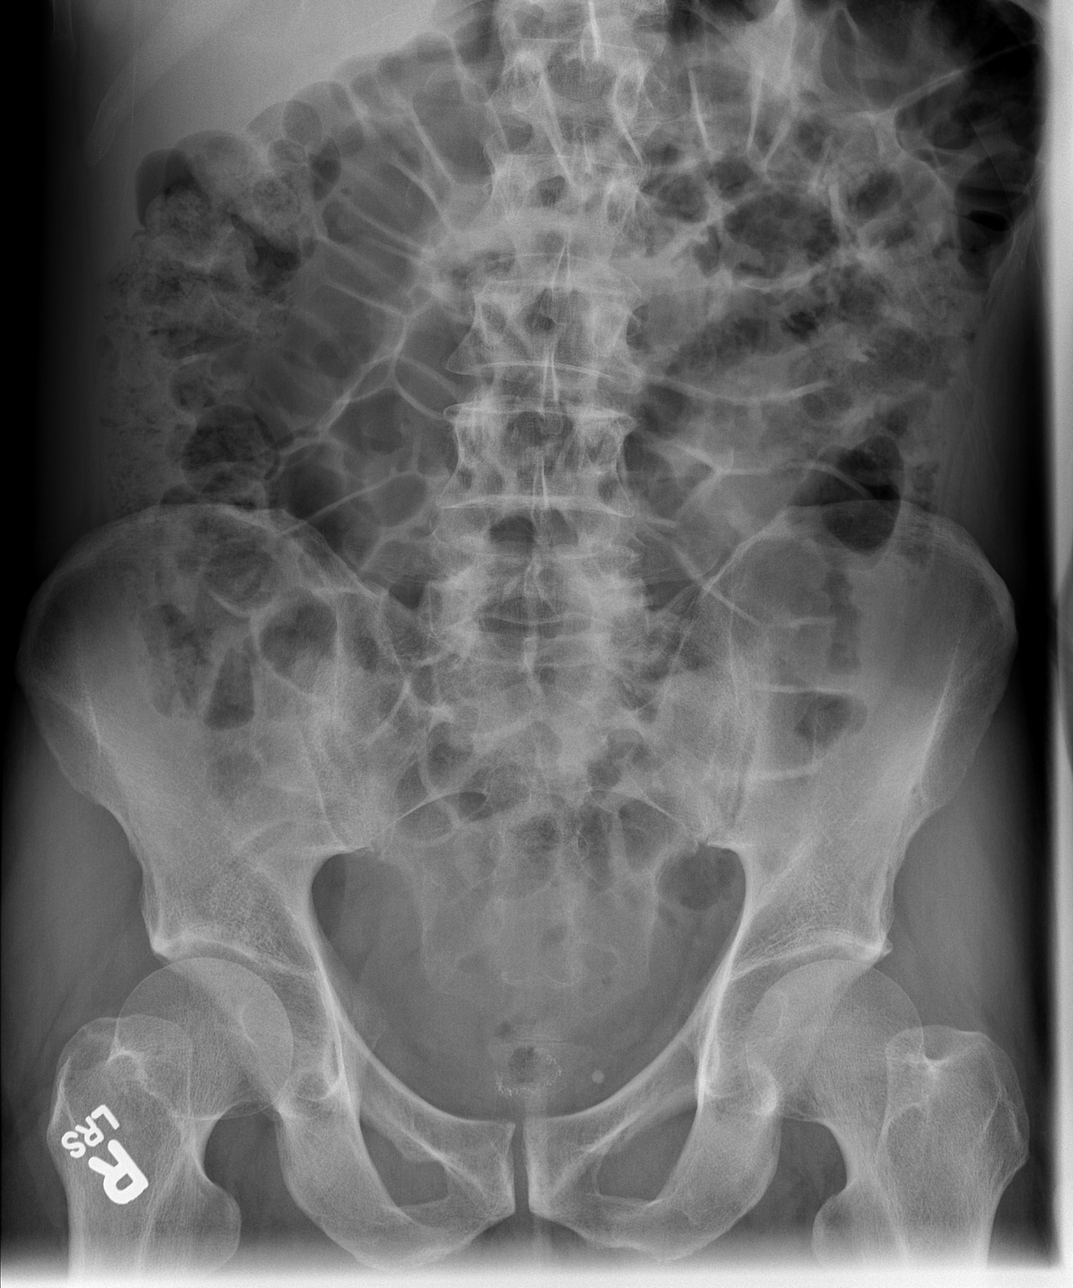

[3 of 3 positions shown; findings below may reference images not displayed]

FINDINGS: The lungs are hyperexpanded, with flattening of the
hemidiaphragms, likely reflecting COPD.  There is no evidence of
focal opacification, pleural effusion or pneumothorax.  The
cardiomediastinal silhouette is within normal limits.

The visualized bowel gas pattern is unremarkable.  Scattered stool
and air are seen within the colon; there is no evidence of small
bowel dilatation to suggest obstruction.  No free intra-abdominal
air is identified on the provided upright view.  Postoperative
change is noted at the lower pelvis.

No acute osseous abnormalities are seen; the sacroiliac joints are
unremarkable in appearance.
IMPRESSION: 1.  Unremarkable bowel gas pattern; no free intra-abdominal air
seen.
2.  No acute cardiopulmonary process identified.  Underlying COPD
noted.

## 2016-07-23 ENCOUNTER — Ambulatory Visit (HOSPITAL_COMMUNITY)
Admission: EM | Admit: 2016-07-23 | Discharge: 2016-07-23 | Disposition: A | Discharge status: Home / Self Care | Payer: Medicare Other | Attending: Family Medicine | Admitting: Family Medicine

## 2016-07-23 ENCOUNTER — Encounter (HOSPITAL_COMMUNITY): Payer: Self-pay | Admitting: *Deleted

## 2016-07-23 DIAGNOSIS — J209 Acute bronchitis, unspecified: Secondary | ICD-10-CM

## 2016-07-23 MED ORDER — BENZONATATE 100 MG PO CAPS: 100.0000 mg | ORAL_CAPSULE | Freq: Three times a day (TID) | ORAL | 0 refills | Status: DC

## 2016-07-23 MED ORDER — AZITHROMYCIN 250 MG PO TABS: 250.0000 mg | ORAL_TABLET | Freq: Every day | ORAL | 0 refills | Status: DC

## 2016-07-23 MED ORDER — PREDNISONE 50 MG PO TABS: ORAL_TABLET | ORAL | 0 refills | Status: DC

## 2016-07-23 NOTE — ED Provider Notes (Signed)
CSN: [id number]     Arrival date & time 07/23/16  1446 History   None    Chief Complaint  Patient presents with  . Cough   (Consider location/radiation/quality/duration/timing/severity/associated sxs/prior Treatment) 66 year old male presents to clinic with chief complaint of cough, fatigue, is been ongoing for greater than one week. States one week ago he had flulike symptoms fever, fatigue, loss of appetite, muscle symptoms are resolved her with the cough has remained. He describes it as a dry, hacking, nonproductive cough with a yellow mucus. He has no chest pain, no shortness of breath, no wheezing, no nausea, no vomiting, no diarrhea, no pain or pressure in his ears, no sinus pain or pressure, no weakness, no dizziness, no swelling in his hands, feet, or ankles. He is a smoker, with a 75-pack-year history.   The history is provided by the patient.  Cough    Past Medical History:  Diagnosis Date  . COPD (chronic obstructive pulmonary disease) (HCC)   . Depression   . Emphysema   . Tobacco abuse    Past Surgical History:  Procedure Laterality Date  . HEMORRHOID SURGERY    . HEMORRHOID SURGERY  2008   History reviewed. No pertinent family history. Social History  Substance Use Topics  . Smoking status: Current Every Day Smoker    Packs/day: 1.50    Years: 50.00    Types: Cigarettes  . Smokeless tobacco: Never Used  . Alcohol use No    Review of Systems  Respiratory: Positive for cough.   All other systems reviewed and are negative.   Allergies  Patient has no known allergies.  Home Medications   Prior to Admission medications   Medication Sig Start Date End Date Taking? Authorizing Provider  albuterol (PROVENTIL HFA;VENTOLIN HFA) 108 (90 BASE) MCG/ACT inhaler Inhale 2 puffs into the lungs every 6 (six) hours as needed for wheezing or shortness of breath. 10/25/13   Rodolph BongEvan S Corey, MD  aspirin EC 81 MG tablet Take 81 mg by mouth daily.    Historical Provider, MD   azithromycin (ZITHROMAX) 250 MG tablet Take 1 tablet (250 mg total) by mouth daily. Take first 2 tablets together, then 1 every day until finished. 07/23/16   Dorena BodoLawrence Rydell Wiegel, NP  benzonatate (TESSALON) 100 MG capsule Take 1 capsule (100 mg total) by mouth every 8 (eight) hours. 07/23/16   Dorena BodoLawrence Mika Griffitts, NP  clonazePAM (KLONOPIN) 0.5 MG tablet Take 0.5 mg by mouth at bedtime as needed. sleep    Historical Provider, MD  hyoscyamine (LEVBID) 0.375 MG 12 hr tablet Take 0.375 mg by mouth 2 (two) times daily. Stomach spasms    Historical Provider, MD  predniSONE (DELTASONE) 50 MG tablet Take 1 tablet daily with food 07/23/16   Dorena BodoLawrence Chyrel Taha, NP  sertraline (ZOLOFT) 50 MG tablet Take 0.5 tablets (25 mg total) by mouth daily. 09/25/11   Alison MurrayAlma M Devine, MD   Meds Ordered and Administered this Visit  Medications - No data to display  BP 132/78 (BP Location: Right Arm)   Pulse 78   Temp 98.6 F (37 C) (Oral)   Resp 18   SpO2 94%  No data found.   Physical Exam  Constitutional: He is oriented to person, place, and time. He appears well-developed and well-nourished. No distress.  HENT:  Head: Normocephalic and atraumatic.  Right Ear: Tympanic membrane and external ear normal.  Left Ear: Tympanic membrane and external ear normal.  Nose: Nose normal. Right sinus exhibits no maxillary sinus tenderness and  no frontal sinus tenderness. Left sinus exhibits no maxillary sinus tenderness and no frontal sinus tenderness.  Mouth/Throat: Uvula is midline and oropharynx is clear and moist. No oropharyngeal exudate.  Eyes: Pupils are equal, round, and reactive to light.  Neck: Normal range of motion. Neck supple. No JVD present.  Cardiovascular: Normal rate and regular rhythm.   Pulmonary/Chest: Effort normal and breath sounds normal. No respiratory distress. He has no wheezes.  Abdominal: Soft. Bowel sounds are normal.  Lymphadenopathy:       Head (right side): No submental, no submandibular, no  tonsillar and no preauricular adenopathy present.       Head (left side): No submental, no submandibular, no tonsillar and no preauricular adenopathy present.    He has no cervical adenopathy.  Neurological: He is alert and oriented to person, place, and time.  Skin: Skin is warm and dry. Capillary refill takes less than 2 seconds. No rash noted. He is not diaphoretic. No erythema.  Psychiatric: He has a normal mood and affect.  Nursing note and vitals reviewed.   Urgent Care Course     Procedures (including critical care time)  Labs Review Labs Reviewed - No data to display  Imaging Review No results found.      MDM   1. Acute bronchitis, unspecified organism    Treating for acute bronchitis, prescription sent for azithromycin, prednisone, Tessalon, counseling given over-the-counter medication second help with symptomatic management, advised to follow-up with his primary care provider if symptoms persist, or fail to resolve.     Dorena Bodo, NP 07/23/16 1640

## 2016-07-23 NOTE — Discharge Instructions (Signed)
I'm treating you today for acute bronchitis. I prescribed azithromycin, take 2 tablets today then one daily until finished, also prescribed prednisone, take one tablet daily with food. For cough I prescribed Tessalon, take one tablet every 8 hours as needed. Recommend you follow-up with your primary care provider to have a  yearly physical

## 2016-07-23 NOTE — ED Triage Notes (Signed)
Pt  Reports  Symptoms   Cough    Body  Aches   And  Diarrhea       Slight  Headache  As   Well  With  Onset  Of  Symptoms   X   1   Week  Pt is a  smoker

## 2018-03-31 ENCOUNTER — Emergency Department (HOSPITAL_COMMUNITY)
Admission: EM | Admit: 2018-03-31 | Discharge: 2018-03-31 | Disposition: A | Discharge status: Home / Self Care | Payer: Medicare Other | Attending: Emergency Medicine | Admitting: Emergency Medicine

## 2018-03-31 ENCOUNTER — Encounter (HOSPITAL_COMMUNITY): Payer: Self-pay | Admitting: Emergency Medicine

## 2018-03-31 DIAGNOSIS — M545 Low back pain, unspecified: Secondary | ICD-10-CM

## 2018-03-31 DIAGNOSIS — Z7982 Long term (current) use of aspirin: Secondary | ICD-10-CM | POA: Insufficient documentation

## 2018-03-31 DIAGNOSIS — F17210 Nicotine dependence, cigarettes, uncomplicated: Secondary | ICD-10-CM | POA: Diagnosis not present

## 2018-03-31 DIAGNOSIS — Z79899 Other long term (current) drug therapy: Secondary | ICD-10-CM | POA: Insufficient documentation

## 2018-03-31 DIAGNOSIS — J449 Chronic obstructive pulmonary disease, unspecified: Secondary | ICD-10-CM | POA: Diagnosis not present

## 2018-03-31 LAB — URINALYSIS, ROUTINE W REFLEX MICROSCOPIC
Bacteria, UA: NONE SEEN
Bilirubin Urine: NEGATIVE
Glucose, UA: NEGATIVE mg/dL
Ketones, ur: NEGATIVE mg/dL
Leukocytes, UA: NEGATIVE
Nitrite: NEGATIVE
Protein, ur: NEGATIVE mg/dL
Specific Gravity, Urine: 1.01 (ref 1.005–1.030)
pH: 7 (ref 5.0–8.0)

## 2018-03-31 MED ORDER — KETOROLAC TROMETHAMINE 15 MG/ML IJ SOLN
15.0000 mg | Freq: Once | INTRAMUSCULAR | Status: AC
  Administered 2018-03-31: 15 mg via INTRAMUSCULAR
  Filled 2018-03-31: qty 1

## 2018-03-31 MED ORDER — OXYCODONE-ACETAMINOPHEN 5-325 MG PO TABS
1.0000 | ORAL_TABLET | Freq: Once | ORAL | Status: AC
  Administered 2018-03-31: 1 via ORAL
  Filled 2018-03-31: qty 1

## 2018-03-31 MED ORDER — DEXAMETHASONE 4 MG PO TABS
8.0000 mg | ORAL_TABLET | Freq: Once | ORAL | Status: AC
  Administered 2018-03-31: 8 mg via ORAL
  Filled 2018-03-31: qty 2

## 2018-03-31 MED ORDER — HYDROCODONE-ACETAMINOPHEN 5-325 MG PO TABS: 2.0000 | ORAL_TABLET | Freq: Four times a day (QID) | ORAL | 0 refills | Status: DC | PRN

## 2018-03-31 MED ORDER — MELOXICAM 7.5 MG PO TABS: 7.5000 mg | ORAL_TABLET | Freq: Every day | ORAL | 0 refills | Status: DC | PRN

## 2018-03-31 NOTE — ED Provider Notes (Signed)
[facility] EMERGENCY DEPARTMENT Provider Note   CSN: [id number] Arrival date & time: 03/31/18  1347     History   Chief Complaint Chief Complaint  Patient presents with  . Back Pain    HPI Jacob Barr is a 67 y.o. male.  HPI   67 year old male with lower back pain.  He has a past history of chronic lower back pain but states that got acutely worse last night while he was in bed.  He went to sleep in his usual state of health.  Says he has pain in his mid lower back and also some radiation to his right flank.  He has some intermittent numbness in his right buttock but says that this is chronic and unchanged.  No weakness.  No prior back surgery.  No urinary complaints.  No rash.  No fevers or chills.  He has not tried taking anything for his symptoms.  Past Medical History:  Diagnosis Date  . COPD (chronic obstructive pulmonary disease) (HCC)   . Depression   . Emphysema   . Tobacco abuse     Patient Active Problem List   Diagnosis Date Noted  . Constipation, chronic 07/03/2011  . Abdominal pain 07/03/2011  . Tobacco abuse 07/02/2011  . Chest pain 07/02/2011  . Hx of hemorrhoids 07/02/2011  . AKI (acute kidney injury) (HCC) 07/02/2011    Past Surgical History:  Procedure Laterality Date  . HEMORRHOID SURGERY    . HEMORRHOID SURGERY  2008        Home Medications    Prior to Admission medications   Medication Sig Start Date End Date Taking? Authorizing Provider  albuterol (PROVENTIL HFA;VENTOLIN HFA) 108 (90 BASE) MCG/ACT inhaler Inhale 2 puffs into the lungs every 6 (six) hours as needed for wheezing or shortness of breath. 10/25/13   Rodolph Bong, MD  aspirin EC 81 MG tablet Take 81 mg by mouth daily.    [provider]  azithromycin (ZITHROMAX) 250 MG tablet Take 1 tablet (250 mg total) by mouth daily. Take first 2 tablets together, then 1 every day until finished. 07/23/16   Dorena Bodo, NP  benzonatate (TESSALON) 100 MG  capsule Take 1 capsule (100 mg total) by mouth every 8 (eight) hours. 07/23/16   Dorena Bodo, NP  clonazePAM (KLONOPIN) 0.5 MG tablet Take 0.5 mg by mouth at bedtime as needed. sleep    [provider]  hyoscyamine (LEVBID) 0.375 MG 12 hr tablet Take 0.375 mg by mouth 2 (two) times daily. Stomach spasms    [provider]  predniSONE (DELTASONE) 50 MG tablet Take 1 tablet daily with food 07/23/16   Dorena Bodo, NP  sertraline (ZOLOFT) 50 MG tablet Take 0.5 tablets (25 mg total) by mouth daily. 09/25/11   Alison Murray, MD    Family History History reviewed. No pertinent family history.  Social History Social History   Tobacco Use  . Smoking status: Current Every Day Smoker    Packs/day: 1.50    Years: 50.00    Pack years: 75.00    Types: Cigarettes  . Smokeless tobacco: Never Used  Substance Use Topics  . Alcohol use: No  . Drug use: No     Allergies   Patient has no known allergies.   Review of Systems Review of Systems  All systems reviewed and negative, other than as noted in HPI.  Physical Exam Updated Vital Signs BP (!) 165/83 (BP Location: Right Arm)   Pulse 100  Temp 98.3 F (36.8 C) (Oral)   Resp 18   Ht 5\' 6"  (1.676 m)   Wt 65.8 kg   SpO2 99%   BMI 23.40 kg/m   Physical Exam  Constitutional: He appears well-developed and well-nourished. No distress.  HENT:  Head: Normocephalic and atraumatic.  Eyes: Conjunctivae are normal. Right eye exhibits no discharge. Left eye exhibits no discharge.  Neck: Neck supple.  Cardiovascular: Normal rate, regular rhythm and normal heart sounds. Exam reveals no gallop and no friction rub.  No murmur heard. Pulmonary/Chest: Effort normal and breath sounds normal. No respiratory distress.  Abdominal: Soft. He exhibits no distension. There is no tenderness.  Musculoskeletal: He exhibits no edema or tenderness.  Back normal to inspection.  Pain is not reproduced with palpation of the back,  flank or abdomen.  Negative straight leg test.  Normal strength in lower extremities.  Sensation intact light touch.  1+ patellar reflexes.  Neurological: He is alert.  Skin: Skin is warm and dry.  Psychiatric: He has a normal mood and affect. His behavior is normal. Thought content normal.  Nursing note and vitals reviewed.    ED Treatments / Results  Labs (all labs ordered are listed, but only abnormal results are displayed) Labs Reviewed - No data to display  EKG None  Radiology No results found.  Procedures Procedures (including critical care time)  Medications Ordered in ED Medications - No data to display   Initial Impression / Assessment and Plan / ED Course  I have reviewed the triage vital signs and the nursing notes.  Pertinent labs & imaging results that were available during my care of the patient were reviewed by me and considered in my medical decision making (see chart for details).     67 year old male with acute on chronic lower back pain.  No trauma.  Afebrile.  Status of pain to his left flank as well.  He has no urinary symptoms though.  UTI is possible consideration although I have a fairly low suspicion.  Consider ureteral stone but I do not quite get this impression either.  Consider biliary colic.  He abdomen is completely nontender though.  He is only had one since symptom onset did not feel like it was any worse after this.  No nausea or vomiting.  This point I am not overly concerned.  I plan to treat symptomatically.  Will check UA though.  Will treat for UTI if signs of such.  Will check CT with right ureteral stone protocol if blood is noted.  Final Clinical Impressions(s) / ED Diagnoses   Final diagnoses:  Midline low back pain without sciatica, unspecified chronicity    ED Discharge Orders    None       Raeford RazorKohut, Aspynn Clover, MD 04/10/18 2209

## 2018-03-31 NOTE — ED Triage Notes (Signed)
Per Pt he comes in due to chronic low back pain that was so intense last night he could not sleep.  Pt says his lower back has hurt x1 year but last night it was worse.  Pt has full movement and is AOx4 NAD noted at this time.

## 2018-06-29 ENCOUNTER — Emergency Department (HOSPITAL_COMMUNITY): Payer: Medicare Other

## 2018-06-29 ENCOUNTER — Emergency Department (HOSPITAL_COMMUNITY)
Admission: EM | Admit: 2018-06-29 | Discharge: 2018-06-29 | Disposition: A | Discharge status: Home / Self Care | Payer: Medicare Other | Attending: Emergency Medicine | Admitting: Emergency Medicine

## 2018-06-29 DIAGNOSIS — F17210 Nicotine dependence, cigarettes, uncomplicated: Secondary | ICD-10-CM | POA: Diagnosis not present

## 2018-06-29 DIAGNOSIS — J449 Chronic obstructive pulmonary disease, unspecified: Secondary | ICD-10-CM | POA: Insufficient documentation

## 2018-06-29 DIAGNOSIS — Z7982 Long term (current) use of aspirin: Secondary | ICD-10-CM | POA: Insufficient documentation

## 2018-06-29 DIAGNOSIS — R42 Dizziness and giddiness: Secondary | ICD-10-CM | POA: Diagnosis not present

## 2018-06-29 DIAGNOSIS — Z79899 Other long term (current) drug therapy: Secondary | ICD-10-CM | POA: Diagnosis not present

## 2018-06-29 LAB — COMPREHENSIVE METABOLIC PANEL
ALT: 15 U/L (ref 0–44)
AST: 18 U/L (ref 15–41)
Albumin: 3.9 g/dL (ref 3.5–5.0)
Alkaline Phosphatase: 69 U/L (ref 38–126)
Anion gap: 10 (ref 5–15)
BUN: 14 mg/dL (ref 8–23)
CO2: 23 mmol/L (ref 22–32)
Calcium: 9.1 mg/dL (ref 8.9–10.3)
Chloride: 100 mmol/L (ref 98–111)
Creatinine, Ser: 1.4 mg/dL — ABNORMAL HIGH (ref 0.61–1.24)
GFR calc Af Amer: 60 mL/min — ABNORMAL LOW (ref >60)
GFR calc non Af Amer: 52 mL/min — ABNORMAL LOW (ref >60)
Glucose, Bld: 112 mg/dL — ABNORMAL HIGH (ref 70–99)
Potassium: 4.1 mmol/L (ref 3.5–5.1)
Sodium: 133 mmol/L — ABNORMAL LOW (ref 135–145)
Total Bilirubin: 0.9 mg/dL (ref 0.3–1.2)
Total Protein: 6.8 g/dL (ref 6.5–8.1)

## 2018-06-29 LAB — CBC WITH DIFFERENTIAL/PLATELET
Abs Immature Granulocytes: 0.03 10*3/uL (ref 0.00–0.07)
Basophils Absolute: 0.1 10*3/uL (ref 0.0–0.1)
Basophils Relative: 1 %
Eosinophils Absolute: 0.1 10*3/uL (ref 0.0–0.5)
Eosinophils Relative: 2 %
HCT: 42.8 % (ref 39.0–52.0)
Hemoglobin: 14.3 g/dL (ref 13.0–17.0)
Immature Granulocytes: 0 %
Lymphocytes Relative: 48 %
Lymphs Abs: 4 10*3/uL (ref 0.7–4.0)
MCH: 30.6 pg (ref 26.0–34.0)
MCHC: 33.4 g/dL (ref 30.0–36.0)
MCV: 91.6 fL (ref 80.0–100.0)
Monocytes Absolute: 0.8 10*3/uL (ref 0.1–1.0)
Monocytes Relative: 10 %
Neutro Abs: 3.2 10*3/uL (ref 1.7–7.7)
Neutrophils Relative %: 39 %
Platelets: 267 10*3/uL (ref 150–400)
RBC: 4.67 MIL/uL (ref 4.22–5.81)
RDW: 12.9 % (ref 11.5–15.5)
WBC: 8.2 10*3/uL (ref 4.0–10.5)
nRBC: 0 % (ref 0.0–0.2)

## 2018-06-29 LAB — TROPONIN I: Troponin I: <0.03 ng/mL (ref <0.03)

## 2018-06-29 MED ORDER — SODIUM CHLORIDE 0.9 % IV BOLUS
500.0000 mL | Freq: Once | INTRAVENOUS | Status: AC
  Administered 2018-06-29: 500 mL via INTRAVENOUS

## 2018-06-29 NOTE — ED Provider Notes (Signed)
[facility] EMERGENCY DEPARTMENT Provider Note   CSN: [id number] Arrival date & time: 06/29/18  0708    History   Chief Complaint Chief Complaint  Patient presents with  . Dizziness    HPI Jacob Barr is a 68 y.o. male.     Patient complains of dizziness today.  When he gets up in the morning.  Patient did not pass out.  This is been happening occasionally  The history is provided by the patient. No language interpreter was used.  Dizziness  Quality:  Lightheadedness Severity:  Mild Onset quality:  Sudden Timing:  Intermittent Progression:  Waxing and waning Chronicity:  New Context: bending over   Relieved by:  Nothing Worsened by:  Nothing Ineffective treatments:  None tried Associated symptoms: no blood in stool, no chest pain, no diarrhea and no headaches   Risk factors: no anemia     Past Medical History:  Diagnosis Date  . COPD (chronic obstructive pulmonary disease) (HCC)   . Depression   . Emphysema   . Tobacco abuse     Patient Active Problem List   Diagnosis Date Noted  . Constipation, chronic 07/03/2011  . Abdominal pain 07/03/2011  . Tobacco abuse 07/02/2011  . Chest pain 07/02/2011  . Hx of hemorrhoids 07/02/2011  . AKI (acute kidney injury) (HCC) 07/02/2011    Past Surgical History:  Procedure Laterality Date  . HEMORRHOID SURGERY    . HEMORRHOID SURGERY  2008        Home Medications    Prior to Admission medications   Medication Sig Start Date End Date Taking? Authorizing Provider  albuterol (PROVENTIL HFA;VENTOLIN HFA) 108 (90 BASE) MCG/ACT inhaler Inhale 2 puffs into the lungs every 6 (six) hours as needed for wheezing or shortness of breath. 10/25/13   Jacob Barr, Jacob S, MD  aspirin EC 81 MG tablet Take 81 mg by mouth daily.    [provider]  azithromycin (ZITHROMAX) 250 MG tablet Take 1 tablet (250 mg total) by mouth daily. Take first 2 tablets together, then 1 every day until finished. 07/23/16    Jacob Barr, Lawrence, NP  benzonatate (TESSALON) 100 MG capsule Take 1 capsule (100 mg total) by mouth every 8 (eight) hours. 07/23/16   Jacob Barr, Lawrence, NP  clonazePAM (KLONOPIN) 0.5 MG tablet Take 0.5 mg by mouth at bedtime as needed. sleep    [provider]  HYDROcodone-acetaminophen (NORCO/VICODIN) 5-325 MG tablet Take 2 tablets by mouth every 6 (six) hours as needed. 03/31/18   Jacob Barr, Stephen, MD  hyoscyamine (LEVBID) 0.375 MG 12 hr tablet Take 0.375 mg by mouth 2 (two) times daily. Stomach spasms    [provider]  meloxicam (MOBIC) 7.5 MG tablet Take 1 tablet (7.5 mg total) by mouth daily as needed for pain. 03/31/18   Jacob Barr, Stephen, MD  predniSONE (DELTASONE) 50 MG tablet Take 1 tablet daily with food 07/23/16   Jacob Barr, Lawrence, NP  sertraline (ZOLOFT) 50 MG tablet Take 0.5 tablets (25 mg total) by mouth daily. 09/25/11   Jacob Barr, Jacob M, MD    Family History No family history on file.  Social History Social History   Tobacco Use  . Smoking status: Current Every Day Smoker    Packs/day: 1.50    Years: 50.00    Pack years: 75.00    Types: Cigarettes  . Smokeless tobacco: Never Used  Substance Use Topics  . Alcohol use: No  . Drug use: No     Allergies   Patient  has no known allergies.   Review of Systems Review of Systems  Constitutional: Negative for appetite change and fatigue.  HENT: Negative for congestion, ear discharge and sinus pressure.   Eyes: Negative for discharge.  Respiratory: Negative for cough.   Cardiovascular: Negative for chest pain.  Gastrointestinal: Negative for abdominal pain, blood in stool and diarrhea.  Genitourinary: Negative for frequency and hematuria.  Musculoskeletal: Negative for back pain.  Skin: Negative for rash.  Neurological: Positive for dizziness. Negative for seizures and headaches.  Psychiatric/Behavioral: Negative for hallucinations.     Physical Exam Updated Vital Signs BP 117/73 (BP Location: Right  Arm)   Pulse (!) 58   Temp 97.9 F (36.6 C) (Oral)   Resp 20   Ht 5\' 6"  (1.676 Barr)   Wt 70.3 kg   SpO2 96%   BMI 25.02 kg/Barr   Physical Exam Vitals signs and nursing note reviewed.  Constitutional:      Appearance: He is well-developed.  HENT:     Head: Normocephalic.     Nose: Nose normal.  Eyes:     General: No scleral icterus.    Conjunctiva/sclera: Conjunctivae normal.  Neck:     Musculoskeletal: Neck supple.     Thyroid: No thyromegaly.  Cardiovascular:     Rate and Rhythm: Normal rate and regular rhythm.     Heart sounds: No murmur. No friction rub. No gallop.   Pulmonary:     Breath sounds: No stridor. No wheezing or rales.  Chest:     Chest wall: No tenderness.  Abdominal:     General: There is no distension.     Tenderness: There is no abdominal tenderness. There is no rebound.  Musculoskeletal: Normal range of motion.  Lymphadenopathy:     Cervical: No cervical adenopathy.  Skin:    Findings: No erythema or rash.  Neurological:     Mental Status: He is alert and oriented to person, place, and time.     Motor: No abnormal muscle tone.     Coordination: Coordination normal.  Psychiatric:        Behavior: Behavior normal.      ED Treatments / Results  Labs (all labs ordered are listed, but only abnormal results are displayed) Labs Reviewed  COMPREHENSIVE METABOLIC PANEL - Abnormal; Notable for the following components:      Result Value   Sodium 133 (*)    Glucose, Bld 112 (*)    Creatinine, Ser 1.40 (*)    GFR calc non Af Amer 52 (*)    GFR calc Af Amer 60 (*)    All other components within normal limits  CBC WITH DIFFERENTIAL/PLATELET  TROPONIN I    EKG EKG Interpretation  Date/Time:  Saturday June 29 2018 07:12:01 EST Ventricular Rate:  61 PR Interval:    QRS Duration: 86 QT Interval:  424 QTC Calculation: 428 R Axis:   62 Text Interpretation:  Sinus rhythm Short PR interval Confirmed by Bethann Berkshire [phone]) on 06/29/2018 9:04:32  AM   Radiology Dg Chest 2 View  Result Date: 06/29/2018 CLINICAL DATA:  Acute chest pain. EXAM: CHEST - 2 VIEW COMPARISON:  09/23/2011 prior radiographs FINDINGS: The cardiomediastinal silhouette is unremarkable. There is no evidence of focal airspace disease, pulmonary edema, suspicious pulmonary nodule/mass, pleural effusion, or pneumothorax. No acute bony abnormalities are identified. IMPRESSION: No active cardiopulmonary disease. Electronically Signed   By: Harmon Pier Barr.D.   On: 06/29/2018 08:48    Procedures Procedures (including critical care time)  Medications Ordered in ED Medications  sodium chloride 0.9 % bolus 500 mL (0 mLs Intravenous Stopped 06/29/18 0836)     Initial Impression / Assessment and Plan / ED Course  I have reviewed the triage vital signs and the nursing notes.  Pertinent labs & imaging results that were available during my care of the patient were reviewed by me and considered in my medical decision making (see chart for details).     Patient with mild dizziness when he gets up in the morning.  Patient has mild dehydration and is mildly orthostatic.  He has been given some fluids.  His labs are unremarkable.  I told him to get up slowly in the morning.  He should sit on the side of the bed for a while till he feels good and then when he stands he should stand there for a minute till he feels okay.  He will follow-up with his PCP  Final Clinical Impressions(Barr) / ED Diagnoses   Final diagnoses:  Dizziness    ED Discharge Orders    None       Bethann Berkshire, MD 06/29/18 [phone]

## 2018-06-29 NOTE — Discharge Instructions (Addendum)
Drink plenty of fluids and follow-up with your doctor next week for recheck °

## 2018-06-29 NOTE — ED Notes (Signed)
Patient verbalizes understanding of discharge instructions. Opportunity for questioning and answers were provided. Pt discharged from ED. 

## 2018-06-29 NOTE — ED Triage Notes (Signed)
Pt from home via ems; had dizziness upon standing this morning, accompanied by central cp; cp resolved with lying down, dizziness still present; increases when he turns head to left; 324 asa given PTA; denies cp currently; hx of same, pt says he had had syncopal episodes in the past that accompanied the dizziness and cp, denies syncope today   116/92 HR 64 regular CBG 142 95% RA RR 18

## 2018-06-29 NOTE — ED Notes (Signed)
Patient transported to X-ray 

## 2020-06-13 ENCOUNTER — Ambulatory Visit
Admission: EM | Admit: 2020-06-13 | Discharge: 2020-06-13 | Disposition: A | Discharge status: Home / Self Care | Payer: Medicare PPO | Attending: Emergency Medicine | Admitting: Emergency Medicine

## 2020-06-13 ENCOUNTER — Other Ambulatory Visit: Payer: Self-pay

## 2020-06-13 ENCOUNTER — Ambulatory Visit (INDEPENDENT_AMBULATORY_CARE_PROVIDER_SITE_OTHER): Payer: Medicare PPO

## 2020-06-13 ENCOUNTER — Encounter: Payer: Self-pay | Admitting: *Deleted

## 2020-06-13 DIAGNOSIS — R059 Cough, unspecified: Secondary | ICD-10-CM | POA: Diagnosis not present

## 2020-06-13 DIAGNOSIS — J441 Chronic obstructive pulmonary disease with (acute) exacerbation: Secondary | ICD-10-CM

## 2020-06-13 MED ORDER — PREDNISONE 20 MG PO TABS: 40.0000 mg | ORAL_TABLET | Freq: Every day | ORAL | 0 refills | Status: AC

## 2020-06-13 MED ORDER — DM-GUAIFENESIN ER 30-600 MG PO TB12: 1.0000 | ORAL_TABLET | Freq: Two times a day (BID) | ORAL | 0 refills | Status: DC

## 2020-06-13 MED ORDER — DOXYCYCLINE HYCLATE 100 MG PO CAPS: 100.0000 mg | ORAL_CAPSULE | Freq: Two times a day (BID) | ORAL | 0 refills | Status: AC

## 2020-06-13 MED ORDER — BENZONATATE 200 MG PO CAPS: 200.0000 mg | ORAL_CAPSULE | Freq: Three times a day (TID) | ORAL | 0 refills | Status: AC | PRN

## 2020-06-13 MED ORDER — FLUTICASONE PROPIONATE 50 MCG/ACT NA SUSP: 1.0000 | Freq: Every day | NASAL | 0 refills | Status: DC

## 2020-06-13 MED ORDER — ALBUTEROL SULFATE HFA 108 (90 BASE) MCG/ACT IN AERS: 1.0000 | INHALATION_SPRAY | Freq: Four times a day (QID) | RESPIRATORY_TRACT | 0 refills | Status: DC | PRN

## 2020-06-13 NOTE — ED Provider Notes (Signed)
[facility]    CSN: [id number] Arrival date & time: 06/13/20  1402      History   Chief Complaint Chief Complaint  Patient presents with  . Nasal Congestion  . Chest Pain    HPI Jacob Barr is a 70 y.o. male history of COPD, tobacco use presenting today for evaluation of chest tightness and congestion.  Reports that he has had cough congestion and chest tightness for months.  He denies any fevers.  Has had associated shortness of breath.  Denies being on any current inhalers for symptoms.  Also reports sinus congestion and ear pain/pressure.  HPI  Past Medical History:  Diagnosis Date  . COPD (chronic obstructive pulmonary disease) (HCC)   . Depression   . Emphysema   . Tobacco abuse     Patient Active Problem List   Diagnosis Date Noted  . Constipation, chronic 07/03/2011  . Abdominal pain 07/03/2011  . Tobacco abuse 07/02/2011  . Chest pain 07/02/2011  . Hx of hemorrhoids 07/02/2011  . AKI (acute kidney injury) (HCC) 07/02/2011    Past Surgical History:  Procedure Laterality Date  . HEMORRHOID SURGERY    . HEMORRHOID SURGERY  2008       Home Medications    Prior to Admission medications   Medication Sig Start Date End Date Taking? Authorizing Provider  albuterol (VENTOLIN HFA) 108 (90 Base) MCG/ACT inhaler Inhale 1-2 puffs into the lungs every 6 (six) hours as needed for wheezing or shortness of breath. 06/13/20  Yes Amire Gossen C, PA-C  benzonatate (TESSALON) 200 MG capsule Take 1 capsule (200 mg total) by mouth 3 (three) times daily as needed for up to 7 days for cough. 06/13/20 06/20/20 Yes Crystal Ellwood C, PA-C  dextromethorphan-guaiFENesin (MUCINEX DM) 30-600 MG 12hr tablet Take 1 tablet by mouth 2 (two) times daily. 06/13/20  Yes Finas Delone C, PA-C  doxycycline (VIBRAMYCIN) 100 MG capsule Take 1 capsule (100 mg total) by mouth 2 (two) times daily for 7 days. 06/13/20 06/20/20 Yes Jakeira Seeman C, PA-C  fluticasone (FLONASE) 50  MCG/ACT nasal spray Place 1-2 sprays into both nostrils daily. 06/13/20  Yes Silverio Hagan C, PA-C  predniSONE (DELTASONE) 20 MG tablet Take 2 tablets (40 mg total) by mouth daily with breakfast for 5 days. 06/13/20 06/18/20 Yes Queen Abbett C, PA-C  aspirin EC 81 MG tablet Take 81 mg by mouth daily.    [provider]  clonazePAM (KLONOPIN) 0.5 MG tablet Take 0.5 mg by mouth at bedtime as needed. sleep    [provider]  hyoscyamine (LEVBID) 0.375 MG 12 hr tablet Take 0.375 mg by mouth 2 (two) times daily. Stomach spasms    [provider]  sertraline (ZOLOFT) 50 MG tablet Take 0.5 tablets (25 mg total) by mouth daily. 09/25/11   Alison Murray, MD    Family History History reviewed. No pertinent family history.  Social History Social History   Tobacco Use  . Smoking status: Current Every Day Smoker    Packs/day: 1.50    Years: 50.00    Pack years: 75.00    Types: Cigarettes  . Smokeless tobacco: Never Used  Vaping Use  . Vaping Use: Never used  Substance Use Topics  . Alcohol use: No  . Drug use: No     Allergies   Patient has no known allergies.   Review of Systems Review of Systems  Constitutional: Negative for activity change, appetite change, chills, fatigue and fever.  HENT:  Positive for congestion. Negative for ear pain, rhinorrhea, sinus pressure, sore throat and trouble swallowing.   Eyes: Negative for discharge and redness.  Respiratory: Positive for cough, chest tightness and shortness of breath.   Cardiovascular: Negative for chest pain.  Gastrointestinal: Negative for abdominal pain, diarrhea, nausea and vomiting.  Musculoskeletal: Negative for myalgias.  Skin: Negative for rash.  Neurological: Negative for dizziness, light-headedness and headaches.     Physical Exam Triage Vital Signs ED Triage Vitals  Enc Vitals Group     BP 06/13/20 1408 (!) 173/99     Pulse Rate 06/13/20 1408 (!) 105     Resp 06/13/20 1408 18     Temp  06/13/20 1408 98 F (36.7 C)     Temp Source 06/13/20 1408 Temporal     SpO2 06/13/20 1408 98 %     Weight --      Height --      Head Circumference --      Peak Flow --      Pain Score 06/13/20 1409 3     Pain Loc --      Pain Edu? --      Excl. in GC? --    No data found.  Updated Vital Signs BP (!) 156/92 (BP Location: Left Arm)   Pulse (!) 105   Temp 98 F (36.7 C) (Temporal)   Resp 18   SpO2 98%   Visual Acuity Right Eye Distance:   Left Eye Distance:   Bilateral Distance:    Right Eye Near:   Left Eye Near:    Bilateral Near:     Physical Exam Vitals and nursing note reviewed.  Constitutional:      Appearance: He is well-developed and well-nourished.     Comments: No acute distress  HENT:     Head: Normocephalic and atraumatic.     Ears:     Comments: Bilateral ears without tenderness to palpation of external auricle, tragus and mastoid, EAC's without erythema or swelling, TM's with good bony landmarks and cone of light. Non erythematous.     Nose: Nose normal.     Mouth/Throat:     Comments: Oral mucosa pink and moist, no tonsillar enlargement or exudate. Posterior pharynx patent and nonerythematous, no uvula deviation or swelling. Normal phonation. Eyes:     Conjunctiva/sclera: Conjunctivae normal.  Cardiovascular:     Rate and Rhythm: Regular rhythm. Tachycardia present.  Pulmonary:     Effort: Pulmonary effort is normal. No respiratory distress.     Comments: Breathing comfortably at rest, prolonged expiratory phase, very faint wheezing noted in bilateral lung fields, slightly more prominent on left Abdominal:     General: There is no distension.  Musculoskeletal:        General: Normal range of motion.     Cervical back: Neck supple.  Skin:    General: Skin is warm and dry.  Neurological:     Mental Status: He is alert and oriented to person, place, and time.  Psychiatric:        Mood and Affect: Mood and affect normal.      UC Treatments  / Results  Labs (all labs ordered are listed, but only abnormal results are displayed) Labs Reviewed - No data to display  EKG   Radiology DG Chest 2 View  Result Date: 06/13/2020 CLINICAL DATA:  Cough. EXAM: CHEST - 2 VIEW COMPARISON:  June 29, 2018. FINDINGS: The heart size and mediastinal contours are within normal limits. Both  lungs are clear. The visualized skeletal structures are unremarkable. IMPRESSION: No active cardiopulmonary disease. Electronically Signed   By: Lupita Raider M.D.   On: 06/13/2020 14:33    Procedures Procedures (including critical care time)  Medications Ordered in UC Medications - No data to display  Initial Impression / Assessment and Plan / UC Course  I have reviewed the triage vital signs and the nursing notes.  Pertinent labs & imaging results that were available during my care of the patient were reviewed by me and considered in my medical decision making (see chart for details).  Clinical Course as of 06/13/20 1442  Sun Jun 13, 2020  1422 156/92 [HW]    Clinical Course User Index [HW] Sharyon Cable, [place] C, [place]    Chest x-ray without any acute pathology, no signs of pneumonia, will treat for COPD exacerbation and refill albuterol inhaler, course of prednisone, doxycycline to cover sinusitis as well as atypicals in lungs given length of symptoms, prednisone course x5 days, Tessalon and Mucinex for cough and congestion relief.  Discussed strict return precautions. Patient verbalized understanding and is agreeable with plan.  Final Clinical Impressions(s) / UC Diagnoses   Final diagnoses:  COPD exacerbation (HCC)     Discharge Instructions     Chest x-ray without any signs of pneumonia Begin doxycycline twice daily for 1 week Prednisone daily for 5 days with food Albuterol inhaler 1 to 2 puffs as needed for shortness of breath, chest tightness and wheezing Tessalon every 8 hours for cough Mucinex DM twice daily for cough and  congestion Rest and fluids Follow-up if not improving or worsening    ED Prescriptions    Medication Sig Dispense Auth. Provider   doxycycline (VIBRAMYCIN) 100 MG capsule Take 1 capsule (100 mg total) by mouth 2 (two) times daily for 7 days. 14 capsule Rosabelle Jupin C, PA-C   predniSONE (DELTASONE) 20 MG tablet Take 2 tablets (40 mg total) by mouth daily with breakfast for 5 days. 10 tablet Alyha Marines C, PA-C   benzonatate (TESSALON) 200 MG capsule Take 1 capsule (200 mg total) by mouth 3 (three) times daily as needed for up to 7 days for cough. 28 capsule Marlos Carmen C, PA-C   albuterol (VENTOLIN HFA) 108 (90 Base) MCG/ACT inhaler Inhale 1-2 puffs into the lungs every 6 (six) hours as needed for wheezing or shortness of breath. 18 g Tylor Gambrill C, PA-C   dextromethorphan-guaiFENesin (MUCINEX DM) 30-600 MG 12hr tablet Take 1 tablet by mouth 2 (two) times daily. 20 tablet Dandrea Widdowson C, PA-C   fluticasone (FLONASE) 50 MCG/ACT nasal spray Place 1-2 sprays into both nostrils daily. 16 g Teresea Donley, [place] C, PA-C     PDMP not reviewed this encounter.   Lew Dawes, [place] 06/13/20 1442

## 2020-06-13 NOTE — ED Triage Notes (Signed)
C/O nasal congestion and chest tightness "for a couple of months" without cough.  Denies any fevers.

## 2020-06-13 NOTE — Discharge Instructions (Signed)
Chest x-ray without any signs of pneumonia Begin doxycycline twice daily for 1 week Prednisone daily for 5 days with food Albuterol inhaler 1 to 2 puffs as needed for shortness of breath, chest tightness and wheezing Tessalon every 8 hours for cough Mucinex DM twice daily for cough and congestion Rest and fluids Follow-up if not improving or worsening

## 2020-06-20 ENCOUNTER — Emergency Department (HOSPITAL_COMMUNITY): Payer: Medicare PPO

## 2020-06-20 ENCOUNTER — Emergency Department (HOSPITAL_COMMUNITY)
Admission: EM | Admit: 2020-06-20 | Discharge: 2020-06-20 | Disposition: A | Discharge status: Home / Self Care | Payer: Medicare PPO | Attending: Emergency Medicine | Admitting: Emergency Medicine

## 2020-06-20 ENCOUNTER — Other Ambulatory Visit: Payer: Self-pay

## 2020-06-20 DIAGNOSIS — Y92002 Bathroom of unspecified non-institutional (private) residence single-family (private) house as the place of occurrence of the external cause: Secondary | ICD-10-CM | POA: Insufficient documentation

## 2020-06-20 DIAGNOSIS — Z20822 Contact with and (suspected) exposure to covid-19: Secondary | ICD-10-CM | POA: Diagnosis not present

## 2020-06-20 DIAGNOSIS — F17210 Nicotine dependence, cigarettes, uncomplicated: Secondary | ICD-10-CM | POA: Insufficient documentation

## 2020-06-20 DIAGNOSIS — S0081XA Abrasion of other part of head, initial encounter: Secondary | ICD-10-CM | POA: Insufficient documentation

## 2020-06-20 DIAGNOSIS — S0990XA Unspecified injury of head, initial encounter: Secondary | ICD-10-CM | POA: Diagnosis present

## 2020-06-20 DIAGNOSIS — R55 Syncope and collapse: Secondary | ICD-10-CM

## 2020-06-20 DIAGNOSIS — I714 Abdominal aortic aneurysm, without rupture: Secondary | ICD-10-CM

## 2020-06-20 DIAGNOSIS — J449 Chronic obstructive pulmonary disease, unspecified: Secondary | ICD-10-CM | POA: Diagnosis not present

## 2020-06-20 DIAGNOSIS — Z7951 Long term (current) use of inhaled steroids: Secondary | ICD-10-CM | POA: Diagnosis not present

## 2020-06-20 DIAGNOSIS — W01198A Fall on same level from slipping, tripping and stumbling with subsequent striking against other object, initial encounter: Secondary | ICD-10-CM | POA: Diagnosis not present

## 2020-06-20 DIAGNOSIS — I7143 Infrarenal abdominal aortic aneurysm, without rupture: Secondary | ICD-10-CM

## 2020-06-20 DIAGNOSIS — R1084 Generalized abdominal pain: Secondary | ICD-10-CM | POA: Diagnosis not present

## 2020-06-20 DIAGNOSIS — Z7982 Long term (current) use of aspirin: Secondary | ICD-10-CM | POA: Diagnosis not present

## 2020-06-20 LAB — CBG MONITORING, ED: Glucose-Capillary: 105 mg/dL — ABNORMAL HIGH (ref 70–99)

## 2020-06-20 LAB — RESP PANEL BY RT-PCR (FLU A&B, COVID) ARPGX2
Influenza A by PCR: NEGATIVE
Influenza B by PCR: NEGATIVE
SARS Coronavirus 2 by RT PCR: NEGATIVE

## 2020-06-20 LAB — URINALYSIS, ROUTINE W REFLEX MICROSCOPIC
Bilirubin Urine: NEGATIVE
Glucose, UA: NEGATIVE mg/dL
Hgb urine dipstick: NEGATIVE
Ketones, ur: NEGATIVE mg/dL
Leukocytes,Ua: NEGATIVE
Nitrite: NEGATIVE
Protein, ur: NEGATIVE mg/dL
Specific Gravity, Urine: 1.01 (ref 1.005–1.030)
pH: 7.5 (ref 5.0–8.0)

## 2020-06-20 LAB — COMPREHENSIVE METABOLIC PANEL
ALT: 23 U/L (ref 0–44)
AST: 21 U/L (ref 15–41)
Albumin: 3.9 g/dL (ref 3.5–5.0)
Alkaline Phosphatase: 70 U/L (ref 38–126)
Anion gap: 9 (ref 5–15)
BUN: 20 mg/dL (ref 8–23)
CO2: 22 mmol/L (ref 22–32)
Calcium: 9 mg/dL (ref 8.9–10.3)
Chloride: 97 mmol/L — ABNORMAL LOW (ref 98–111)
Creatinine, Ser: 1.28 mg/dL — ABNORMAL HIGH (ref 0.61–1.24)
GFR, Estimated: >60 mL/min (ref >60)
Glucose, Bld: 114 mg/dL — ABNORMAL HIGH (ref 70–99)
Potassium: 4 mmol/L (ref 3.5–5.1)
Sodium: 128 mmol/L — ABNORMAL LOW (ref 135–145)
Total Bilirubin: 0.8 mg/dL (ref 0.3–1.2)
Total Protein: 6.2 g/dL — ABNORMAL LOW (ref 6.5–8.1)

## 2020-06-20 LAB — PROTIME-INR
INR: 1.1 (ref 0.8–1.2)
Prothrombin Time: 13.3 seconds (ref 11.4–15.2)

## 2020-06-20 LAB — CBC WITH DIFFERENTIAL/PLATELET
Abs Immature Granulocytes: 0.07 10*3/uL (ref 0.00–0.07)
Basophils Absolute: 0 10*3/uL (ref 0.0–0.1)
Basophils Relative: 0 %
Eosinophils Absolute: 0 10*3/uL (ref 0.0–0.5)
Eosinophils Relative: 0 %
HCT: 41.7 % (ref 39.0–52.0)
Hemoglobin: 14.4 g/dL (ref 13.0–17.0)
Immature Granulocytes: 1 %
Lymphocytes Relative: 23 %
Lymphs Abs: 2.4 10*3/uL (ref 0.7–4.0)
MCH: 31.6 pg (ref 26.0–34.0)
MCHC: 34.5 g/dL (ref 30.0–36.0)
MCV: 91.4 fL (ref 80.0–100.0)
Monocytes Absolute: 0.8 10*3/uL (ref 0.1–1.0)
Monocytes Relative: 8 %
Neutro Abs: 7.3 10*3/uL (ref 1.7–7.7)
Neutrophils Relative %: 68 %
Platelets: 299 10*3/uL (ref 150–400)
RBC: 4.56 MIL/uL (ref 4.22–5.81)
RDW: 12.5 % (ref 11.5–15.5)
WBC: 10.6 10*3/uL — ABNORMAL HIGH (ref 4.0–10.5)
nRBC: 0 % (ref 0.0–0.2)

## 2020-06-20 LAB — LIPASE, BLOOD: Lipase: 27 U/L (ref 11–51)

## 2020-06-20 MED ORDER — FAMOTIDINE IN NACL 20-0.9 MG/50ML-% IV SOLN
20.0000 mg | Freq: Once | INTRAVENOUS | Status: AC
  Administered 2020-06-20: 20 mg via INTRAVENOUS
  Filled 2020-06-20: qty 50

## 2020-06-20 MED ORDER — SODIUM CHLORIDE 0.9 % IV BOLUS
1000.0000 mL | Freq: Once | INTRAVENOUS | Status: AC
  Administered 2020-06-20: 1000 mL via INTRAVENOUS

## 2020-06-20 MED ORDER — PANTOPRAZOLE SODIUM 20 MG PO TBEC: 20.0000 mg | DELAYED_RELEASE_TABLET | Freq: Every day | ORAL | 0 refills | Status: DC

## 2020-06-20 MED ORDER — IOHEXOL 300 MG/ML  SOLN
100.0000 mL | Freq: Once | INTRAMUSCULAR | Status: AC | PRN
  Administered 2020-06-20: 100 mL via INTRAVENOUS

## 2020-06-20 MED ORDER — SODIUM CHLORIDE 0.9 % IV SOLN: INTRAVENOUS | Status: DC

## 2020-06-20 NOTE — ED Triage Notes (Signed)
Pt BIBA from home after a syncopal episode witnessed by his wife. Pt states he has had diffuse abdominal pain for about a month, but started to have diarrhea last night at 1900. This morning when patient ambulated to the bathroom he fell forward and passed out. Abrasions assessed to forehead. Pt not on blood thinners. Est LOC less than 1 minute. GCS 15.

## 2020-06-20 NOTE — ED Notes (Signed)
CBG en route 128.

## 2020-06-20 NOTE — Discharge Instructions (Addendum)
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   This is not causing your pain, but it is something you need to know that you have. This will need to be followed by ultrasound every 3 years to see if it is growing.  Wear compression hose.  You need to establish care with a primary care doctor.  You also need to see a GI doctor.

## 2020-06-20 NOTE — ED Provider Notes (Signed)
[facility] EMERGENCY DEPARTMENT Provider Note   CSN: [id number] Arrival date & time: 06/20/20  3785     History Chief Complaint  Patient presents with  . Loss of Consciousness    Jacob Barr is a 70 y.o. male.  Pt presents to the ED today with a loc.  Pt said he has had intermittent abdominal pain for the past month.  Pain came back last night and was associated with a lot of diarrhea.  He woke up this am and went to go to the bathroom.  He said he felt "woozy" and sat back down.  His wife brought in a portable commode.  He got up to use it and passed out.  He did hit his head.  LOC for about 1 min.  He is not on blood thinners.  He denies any sob or cough.  However, was recently treated (2/6) for a COPD exacerbation with doxy, prednisone, albuterol, tessalon and mucinex dm.  No covid swab obtained.  Pt said prior to passing out he did not have any cp or sob.  Pt was here 2 years ago for dizziness and near-syncope when standing.         Past Medical History:  Diagnosis Date  . COPD (chronic obstructive pulmonary disease) (HCC)   . Depression   . Emphysema   . Tobacco abuse     Patient Active Problem List   Diagnosis Date Noted  . Constipation, chronic 07/03/2011  . Abdominal pain 07/03/2011  . Tobacco abuse 07/02/2011  . Chest pain 07/02/2011  . Hx of hemorrhoids 07/02/2011  . AKI (acute kidney injury) (HCC) 07/02/2011    Past Surgical History:  Procedure Laterality Date  . HEMORRHOID SURGERY    . HEMORRHOID SURGERY  2008       No family history on file.  Social History   Tobacco Use  . Smoking status: Current Every Day Smoker    Packs/day: 1.50    Years: 50.00    Pack years: 75.00    Types: Cigarettes  . Smokeless tobacco: Never Used  Vaping Use  . Vaping Use: Never used  Substance Use Topics  . Alcohol use: No  . Drug use: No    Home Medications Prior to Admission medications   Medication Sig Start Date End Date Taking?  Authorizing Provider  pantoprazole (PROTONIX) 20 MG tablet Take 1 tablet (20 mg total) by mouth daily. 06/20/20  Yes Jacalyn Lefevre, MD  albuterol (VENTOLIN HFA) 108 (90 Base) MCG/ACT inhaler Inhale 1-2 puffs into the lungs every 6 (six) hours as needed for wheezing or shortness of breath. 06/13/20   Wieters, Hallie C, PA-C  aspirin EC 81 MG tablet Take 81 mg by mouth daily.    [provider]  benzonatate (TESSALON) 200 MG capsule Take 1 capsule (200 mg total) by mouth 3 (three) times daily as needed for up to 7 days for cough. 06/13/20 06/20/20  Wieters, Hallie C, PA-C  clonazePAM (KLONOPIN) 0.5 MG tablet Take 0.5 mg by mouth at bedtime as needed. sleep    [provider]  dextromethorphan-guaiFENesin (MUCINEX DM) 30-600 MG 12hr tablet Take 1 tablet by mouth 2 (two) times daily. 06/13/20   Wieters, Hallie C, PA-C  doxycycline (VIBRAMYCIN) 100 MG capsule Take 1 capsule (100 mg total) by mouth 2 (two) times daily for 7 days. 06/13/20 06/20/20  Wieters, Hallie C, PA-C  fluticasone (FLONASE) 50 MCG/ACT nasal spray Place 1-2 sprays into both nostrils daily. 06/13/20  Wieters, Hallie C, PA-C  hyoscyamine (LEVBID) 0.375 MG 12 hr tablet Take 0.375 mg by mouth 2 (two) times daily. Stomach spasms    [provider]  sertraline (ZOLOFT) 50 MG tablet Take 0.5 tablets (25 mg total) by mouth daily. 09/25/11   Alison Murray, MD    Allergies    Patient has no known allergies.  Review of Systems   Review of Systems  HENT:       Facial pain  Gastrointestinal: Positive for abdominal pain.  Neurological: Positive for syncope.  All other systems reviewed and are negative.   Physical Exam Updated Vital Signs BP 137/78   Pulse (!) 59   Temp 97.9 F (36.6 C) (Oral)   Resp 15   SpO2 98%   Physical Exam Vitals and nursing note reviewed.  Constitutional:      Appearance: Normal appearance.  HENT:     Head: Normocephalic and atraumatic.     Comments: Multiple facial abrasions    Right  Ear: External ear normal.     Left Ear: External ear normal.     Nose: Nose normal.     Mouth/Throat:     Mouth: Mucous membranes are moist.     Pharynx: Oropharynx is clear.  Eyes:     Extraocular Movements: Extraocular movements intact.     Conjunctiva/sclera: Conjunctivae normal.     Pupils: Pupils are equal, round, and reactive to light.  Cardiovascular:     Rate and Rhythm: Normal rate and regular rhythm.     Pulses: Normal pulses.     Heart sounds: Normal heart sounds.  Pulmonary:     Effort: Pulmonary effort is normal.     Breath sounds: Normal breath sounds.  Abdominal:     General: Abdomen is flat. Bowel sounds are normal.     Palpations: Abdomen is soft.     Tenderness: There is generalized abdominal tenderness.  Musculoskeletal:        General: Normal range of motion.     Cervical back: Normal range of motion and neck supple.  Skin:    General: Skin is warm.     Capillary Refill: Capillary refill takes less than 2 seconds.  Neurological:     General: No focal deficit present.     Mental Status: He is alert and oriented to person, place, and time.  Psychiatric:        Mood and Affect: Mood normal.        Behavior: Behavior normal.        Thought Content: Thought content normal.        Judgment: Judgment normal.     ED Results / Procedures / Treatments   Labs (all labs ordered are listed, but only abnormal results are displayed) Labs Reviewed  CBC WITH DIFFERENTIAL/PLATELET - Abnormal; Notable for the following components:      Result Value   WBC 10.6 (*)    All other components within normal limits  COMPREHENSIVE METABOLIC PANEL - Abnormal; Notable for the following components:   Sodium 128 (*)    Chloride 97 (*)    Glucose, Bld 114 (*)    Creatinine, Ser 1.28 (*)    Total Protein 6.2 (*)    All other components within normal limits  CBG MONITORING, ED - Abnormal; Notable for the following components:   Glucose-Capillary 105 (*)    All other components  within normal limits  RESP PANEL BY RT-PCR (FLU A&B, COVID) [id number]  PROTIME-INR  URINALYSIS, ROUTINE  W REFLEX MICROSCOPIC  LIPASE, BLOOD    EKG EKG Interpretation  Date/Time:  Sunday June 20 2020 08:45:01 EST Ventricular Rate:  62 PR Interval:    QRS Duration: 80 QT Interval:  404 QTC Calculation: 411 R Axis:   48 Text Interpretation: Sinus rhythm Low voltage, extremity leads Abnormal R-wave progression, early transition No significant change since last tracing Confirmed by Jacalyn LefevreHaviland, Letrell Attwood [phone]([id number]) on 06/20/2020 9:22:16 AM   Radiology CT Head Wo Contrast  Result Date: 06/20/2020 CLINICAL DATA:  Facial trauma after syncope. EXAM: CT HEAD WITHOUT CONTRAST CT MAXILLOFACIAL WITHOUT CONTRAST TECHNIQUE: Multidetector CT imaging of the head and maxillofacial structures were performed using the standard protocol without intravenous contrast. Multiplanar CT image reconstructions of the maxillofacial structures were also generated. COMPARISON:  CT head dated Sep 23, 2011. FINDINGS: CT HEAD FINDINGS Brain: No evidence of acute infarction, hemorrhage, hydrocephalus, extra-axial collection or mass lesion/mass effect. Vascular: No hyperdense vessel or unexpected calcification. Skull: Normal. Negative for fracture or focal lesion. Other: None. CT MAXILLOFACIAL FINDINGS Osseous: No fracture or mandibular dislocation. No destructive process. Orbits: Negative. No traumatic or inflammatory finding. Sinuses: Clear. Soft tissues: None. IMPRESSION: 1. No acute intracranial abnormality. 2. No acute maxillofacial fracture. Electronically Signed   By: Obie DredgeWilliam T Derry M.D.   On: 06/20/2020 10:36   CT ABDOMEN PELVIS W CONTRAST  Result Date: 06/20/2020 CLINICAL DATA:  Abdominal pain, diarrhea EXAM: CT ABDOMEN AND PELVIS WITH CONTRAST TECHNIQUE: Multidetector CT imaging of the abdomen and pelvis was performed using the standard protocol following bolus administration of intravenous contrast. CONTRAST:  100mL  OMNIPAQUE IOHEXOL 300 MG/ML  SOLN COMPARISON:  09/24/2011 FINDINGS: Lower chest: No acute abnormality. Hepatobiliary: No solid liver abnormality is seen. No gallstones, gallbladder wall thickening, or biliary dilatation. Pancreas: Unremarkable. No pancreatic ductal dilatation or surrounding inflammatory changes. Spleen: Normal in size without significant abnormality. Adrenals/Urinary Tract: Adrenal glands are unremarkable. Kidneys are normal, without renal calculi, solid lesion, or hydronephrosis. Bladder is unremarkable. Stomach/Bowel: Stomach is within normal limits. Appendix appears normal. No evidence of bowel wall thickening, distention, or inflammatory changes. Vascular/Lymphatic: Aortic atherosclerosis. Aneurysm of the infrarenal abdominal aorta measuring up to 3.2 x 2.9 cm, new compared to prior examination. No enlarged abdominal or pelvic lymph nodes. Reproductive: No mass or other significant abnormality. Other: No abdominal wall hernia or abnormality. No abdominopelvic ascites. Musculoskeletal: No acute or significant osseous findings. IMPRESSION: 1. No acute CT findings of the abdomen or pelvis to explain abdominal pain or diarrhea. 2. Aneurysm of the infrarenal abdominal aorta measuring up to 3.2 x 2.9 cm, new compared to prior examination. Recommend follow-up ultrasound every 3 years. This recommendation follows ACR consensus guidelines: White Paper of the ACR Incidental Findings Committee II on Vascular Findings. J Am Coll Radiol 2013; 10:789-794. Aortic Atherosclerosis (ICD10-I70.0). Electronically Signed   By: Lauralyn PrimesAlex  Bibbey M.D.   On: 06/20/2020 10:32   DG Chest Port 1 View  Result Date: 06/20/2020 CLINICAL DATA:  Syncope EXAM: PORTABLE CHEST 1 VIEW COMPARISON:  06/13/2020 chest radiograph. FINDINGS: Stable cardiomediastinal silhouette with normal heart size. No pneumothorax. No pleural effusion. Mildly hyperinflated lungs. No pulmonary edema. No acute consolidative airspace disease. IMPRESSION:  Mildly hyperinflated lungs, cannot exclude obstructive lung disease. Otherwise no active cardiopulmonary disease. Electronically Signed   By: Delbert PhenixJason A Poff M.D.   On: 06/20/2020 09:25   CT Maxillofacial Wo Contrast  Result Date: 06/20/2020 CLINICAL DATA:  Facial trauma after syncope. EXAM: CT HEAD WITHOUT CONTRAST CT MAXILLOFACIAL WITHOUT CONTRAST TECHNIQUE: Multidetector CT imaging of  the head and maxillofacial structures were performed using the standard protocol without intravenous contrast. Multiplanar CT image reconstructions of the maxillofacial structures were also generated. COMPARISON:  CT head dated Sep 23, 2011. FINDINGS: CT HEAD FINDINGS Brain: No evidence of acute infarction, hemorrhage, hydrocephalus, extra-axial collection or mass lesion/mass effect. Vascular: No hyperdense vessel or unexpected calcification. Skull: Normal. Negative for fracture or focal lesion. Other: None. CT MAXILLOFACIAL FINDINGS Osseous: No fracture or mandibular dislocation. No destructive process. Orbits: Negative. No traumatic or inflammatory finding. Sinuses: Clear. Soft tissues: None. IMPRESSION: 1. No acute intracranial abnormality. 2. No acute maxillofacial fracture. Electronically Signed   By: Obie Dredge M.D.   On: 06/20/2020 10:36    Procedures Procedures   Medications Ordered in ED Medications  sodium chloride 0.9 % bolus 1,000 mL (1,000 mLs Intravenous New Bag/Given 06/20/20 0904)    And  0.9 %  sodium chloride infusion ( Intravenous New Bag/Given 06/20/20 0904)  famotidine (PEPCID) IVPB 20 mg premix (20 mg Intravenous New Bag/Given 06/20/20 1138)  iohexol (OMNIPAQUE) 300 MG/ML solution 100 mL (100 mLs Intravenous Contrast Given 06/20/20 1009)    ED Course  I have reviewed the triage vital signs and the nursing notes.  Pertinent labs & imaging results that were available during my care of the patient were reviewed by me and considered in my medical decision making (see chart for details).     MDM Rules/Calculators/A&P                         Syncopal event is likely from orthostasis.  He is mildly orthostatic here.  Pt encouraged to wear compression hose and to get up slowly.  Pt said he does not have a pcp.  He said his insurance changed and the only doctor in [place] does not accept his medicare.  Pt is told to try find out where he can go and establish care with pcp.  He is also to f/u with GI.  His wife has seen Dr. Dulce Sellar in the past and he wants to see him.  I will start him on protonix to see if that will help his abdominal pain.  Pt told of the aneurysm and the need for follow up.  Final Clinical Impression(s) / ED Diagnoses Final diagnoses:  Generalized abdominal pain  Aneurysm of infrarenal abdominal aorta (HCC)  Syncope, unspecified syncope type  Facial abrasion, initial encounter    Rx / DC Orders ED Discharge Orders         Ordered    pantoprazole (PROTONIX) 20 MG tablet  Daily        06/20/20 1118           Jacalyn Lefevre, MD 06/20/20 1139

## 2020-06-25 ENCOUNTER — Other Ambulatory Visit: Payer: Self-pay | Admitting: Physician Assistant

## 2020-06-25 DIAGNOSIS — I714 Abdominal aortic aneurysm, without rupture, unspecified: Secondary | ICD-10-CM

## 2020-06-29 ENCOUNTER — Encounter: Payer: Self-pay | Admitting: *Deleted

## 2020-06-29 ENCOUNTER — Other Ambulatory Visit: Payer: Self-pay

## 2020-06-29 ENCOUNTER — Ambulatory Visit (INDEPENDENT_AMBULATORY_CARE_PROVIDER_SITE_OTHER): Payer: Medicare PPO

## 2020-06-29 ENCOUNTER — Ambulatory Visit: Payer: Medicare PPO | Admitting: Cardiology

## 2020-06-29 ENCOUNTER — Encounter: Payer: Self-pay | Admitting: Cardiology

## 2020-06-29 VITALS — BP 140/80 | HR 107 | Ht 66.0 in | Wt 149.0 lb

## 2020-06-29 DIAGNOSIS — R55 Syncope and collapse: Secondary | ICD-10-CM

## 2020-06-29 DIAGNOSIS — I714 Abdominal aortic aneurysm, without rupture, unspecified: Secondary | ICD-10-CM

## 2020-06-29 DIAGNOSIS — I951 Orthostatic hypotension: Secondary | ICD-10-CM

## 2020-06-29 DIAGNOSIS — Z720 Tobacco use: Secondary | ICD-10-CM | POA: Diagnosis not present

## 2020-06-29 MED ORDER — ROSUVASTATIN CALCIUM 10 MG PO TABS: 10.0000 mg | ORAL_TABLET | Freq: Every day | ORAL | 3 refills | Status: DC

## 2020-06-29 MED ORDER — ASPIRIN EC 81 MG PO TBEC: 81.0000 mg | DELAYED_RELEASE_TABLET | Freq: Every day | ORAL | 3 refills | Status: DC

## 2020-06-29 NOTE — Progress Notes (Signed)
Cardiology Office Note:    Date:  06/29/2020   ID:  Jacob Barr, DOB [birth year], MRN [id number]  PCP:  [facility]  Cardiologist:  No primary care provider on file.  Advanced Practice Provider:  No care team member to display Electrophysiologist:  None   Referring MD: Jacob Albee, PA-C    History of Present Illness:    Jacob Barr is a 70 y.o. male with a hx of tobacco abuse, COPD and depression who was referred by Quentin Mulling, PA-C for further evaluation of syncope.  Patient seen in [facility] ED on 06/20/20 for episode of LOC after having several episodes of diarrhea. Passed out while on the commode. Was out for 1 minute before coming back to. Went to the ER where he was deemed to be orthostatic. CT abdomen pelvis without acute pathology; incidentally found to have infrarenal AAA. He was given IVF with improvement.  Patient states that he has been having syncopal episodes over the past 3 years. Over that time period, he has had 4-5 episodes of full syncope. Has had some lightheadedness especially with position changes. Always occurs when going from laying to standing position when waking up in the morning or in the middle of the night to urinate. Has some heart racing before passing out. No chest pain, SOB, orthopnea or PND. Not on any blood pressure medications. Na is chronically low at 128. No exertional symptoms. Patient is able to walk a half mile without SOB or chest discomfort. He is active and always on his feet without issues. Continues to smoke but is trying to quit.   Past Medical History:  Diagnosis Date  . COPD (chronic obstructive pulmonary disease) (HCC)   . Depression   . Emphysema   . Tobacco abuse     Past Surgical History:  Procedure Laterality Date  . HEMORRHOID SURGERY    . HEMORRHOID SURGERY  2008    Current Medications: Current Meds  Medication Sig  . albuterol (VENTOLIN HFA) 108 (90 Base) MCG/ACT inhaler  Inhale 1-2 puffs into the lungs every 6 (six) hours as needed for wheezing or shortness of breath.  Marland Kitchen aspirin EC 81 MG tablet Take 1 tablet (81 mg total) by mouth daily. Swallow whole.  . fluticasone (FLONASE) 50 MCG/ACT nasal spray Place 1-2 sprays into both nostrils daily. (Patient taking differently: Place 1-2 sprays into both nostrils as needed.)  . hyoscyamine (LEVBID) 0.375 MG 12 hr tablet Take 0.375 mg by mouth daily. Stomach spasms  . pantoprazole (PROTONIX) 20 MG tablet Take 1 tablet (20 mg total) by mouth daily.  . rosuvastatin (CRESTOR) 10 MG tablet Take 1 tablet (10 mg total) by mouth daily.  . [DISCONTINUED] aspirin EC 81 MG tablet Take 81 mg by mouth daily.  . [DISCONTINUED] clonazePAM (KLONOPIN) 0.5 MG tablet Take 0.5 mg by mouth at bedtime as needed. sleep  . [DISCONTINUED] dextromethorphan-guaiFENesin (MUCINEX DM) 30-600 MG 12hr tablet Take 1 tablet by mouth 2 (two) times daily.  . [DISCONTINUED] sertraline (ZOLOFT) 50 MG tablet Take 0.5 tablets (25 mg total) by mouth daily.     Allergies:   Patient has no known allergies.   Social History   Socioeconomic History  . Marital status: Married    Spouse name: Not on file  . Number of children: Not on file  . Years of education: Not on file  . Highest education level: Not on file  Occupational History  . Not on file  Tobacco Use  . Smoking status: Current Every Day Smoker    Packs/day: 1.50    Years: 50.00    Pack years: 75.00    Types: Cigarettes  . Smokeless tobacco: Never Used  Vaping Use  . Vaping Use: Never used  Substance and Sexual Activity  . Alcohol use: No  . Drug use: No  . Sexual activity: Not on file  Other Topics Concern  . Not on file  Social History Narrative   Married, Lives near [place].  Sees Dr. Shelah Lewandowsky at [facility]   Social Determinants of Health   Financial Resource Strain: Not on file  Food Insecurity: Not on file  Transportation Needs: Not on file  Physical  Activity: Not on file  Stress: Not on file  Social Connections: Not on file     Family History: The patient's family history is not on file.  ROS:   Please see the history of present illness.    Review of Systems  Constitutional: Negative for chills, fever and malaise/fatigue.  HENT: Negative for sore throat.   Eyes: Negative for photophobia and redness.  Respiratory: Negative for shortness of breath and stridor.   Cardiovascular: Negative for chest pain, palpitations, orthopnea, claudication, leg swelling and PND.  Gastrointestinal: Negative for nausea and vomiting.  Genitourinary: Negative for dysuria and flank pain.  Musculoskeletal: Positive for back pain and falls.  Neurological: Positive for dizziness and loss of consciousness.  Endo/Heme/Allergies: Negative for polydipsia.  Psychiatric/Behavioral: Negative for memory loss. The patient has insomnia.     EKGs/Labs/Other Studies Reviewed:    The following studies were reviewed today: CT abdomen pelvis 06/20/20: IMPRESSION: 1. No acute CT findings of the abdomen or pelvis to explain abdominal pain or diarrhea. 2. Aneurysm of the infrarenal abdominal aorta measuring up to 3.2 x 2.9 cm, new compared to prior examination. Recommend follow-up ultrasound every 3 years. This recommendation follows ACR consensus guidelines: White Paper of the ACR Incidental Findings Committee II on Vascular Findings. J Am Coll Radiol 2013; 10:789-794.  EKG:  EKG 06/21/20: NSR with HR 62; low voltage in limb leads  Recent Labs: 06/20/2020: ALT 23; BUN 20; Creatinine, Ser 1.28; Hemoglobin 14.4; Platelets 299; Potassium 4.0; Sodium 128  Recent Lipid Panel    Component Value Date/Time   CHOL 154 07/03/2011 0550   TRIG 71 07/03/2011 0550   HDL 71 07/03/2011 0550   CHOLHDL 2.2 07/03/2011 0550   VLDL 14 07/03/2011 0550   LDLCALC 69 07/03/2011 0550     Physical Exam:    VS:  BP 140/80   Pulse (!) 107   Ht  (1.676 m)   Wt 149 lb (67.6  kg)   SpO2 99%   BMI 24.05 kg/m     Wt Readings from Last 3 Encounters:  06/29/20 149 lb (67.6 kg)  06/29/18 155 lb (70.3 kg)  03/31/18 145 lb (65.8 kg)     GEN:  Well nourished, well developed in no acute distress HEENT: Normal NECK: No JVD; No carotid bruits CARDIAC: RRR, no murmurs, rubs, gallops RESPIRATORY:  Diminished but no wheezing or crackles ABDOMEN: Soft, non-tender, non-distended MUSCULOSKELETAL:  No edema; No deformity  SKIN: Warm and dry NEUROLOGIC:  Alert and oriented x 3 PSYCHIATRIC:  Normal affect   ASSESSMENT:    1. Syncope and collapse   2. Tobacco abuse   3. Orthostatic hypotension   4. Abdominal aortic aneurysm (AAA) 3.0 cm to 5.5 cm in diameter in male [facility])  PLAN:    In order of problems listed above:  #Syncope:  #Orthostatic Hypotension: Patient with episode of syncope after several episodes of diarrhea found to be orthostatic in the ED. CT abdomen/plevis with incidental AAA, but no other acute pathology. Patient denies any chest pain, palpitations, SOB. States that syncope always occurs when he first wakes up in the morning and tries to get out of bed or when he wakens in the middle of the night to urinate. Specifically, it occurs when going from a laying to a standing to position. Symptoms highly concerning for orthostatic syncope which may be exacerbated by vagal episode when trying to urinate. No symptoms with exertion and patient is overall active without exertional symptoms. -Will check TTE to ensure no valvular abnormality -Check 2 week zio monitor -Counseled extensively about slow position changes waiting a period of time when going from laying to seated and seated to standing -Compression socks/abdominal binders -Maintain adequate hydration, Na intake and not to skip meals -When feels faint, he needs to lay down and put his legs up to improve venous return -When getting up to urinate in the middle of the night, counseled that he should sit  to avoid fainting from a standing position -No anti-hypertensive medications as will make symptoms worse  #Abdominal Aortic Aneurysm: Infrarenal abdominal aorta measuring up to 3.2 x 2.9 cm -Repeat imaging in 3 years -ASA 81mg  daily -Crestor 10mg  daily  #Tobacco Abuse: -Counseled extensively about cessation and patient motivated to quit -Check lung cancer screening CT     Medication Adjustments/Labs and Tests Ordered: Current medicines are reviewed at length with the patient today.  Concerns regarding medicines are outlined above.  Orders Placed This Encounter  Procedures  . CT CHEST LUNG CA SCREEN LOW DOSE W/O CM  . LONG TERM MONITOR (3-14 DAYS)  . ECHOCARDIOGRAM COMPLETE   Meds ordered this encounter  Medications  . rosuvastatin (CRESTOR) 10 MG tablet    Sig: Take 1 tablet (10 mg total) by mouth daily.    Dispense:  90 tablet    Refill:  3  . aspirin EC 81 MG tablet    Sig: Take 1 tablet (81 mg total) by mouth daily. Swallow whole.    Dispense:  90 tablet    Refill:  3    Patient Instructions   Medication Instructions:  Your physician has recommended you make the following change in your medication:  1.  START Crestor 10 mg taking  1 daily 2.  START Aspirin 81 mg daily   *If you need a refill on your cardiac medications before your next appointment, please call your pharmacy*   Lab Work: None ordered  If you have labs (blood work) drawn today and your tests are completely normal, you will receive your results only by: MyChart Message (if you have MyChart) OR . A paper copy in the mail If you have any lab test that is abnormal or we need to change your treatment, we will call you to review the results.   Testing/Procedures: Non-Cardiac CT scanning, (CAT scanning), is a noninvasive, special x-ray that produces cross-sectional images of the body using x-rays and a computer. CT scans help physicians diagnose and treat medical conditions. For some CT exams, a  contrast material is used to enhance visibility in the area of the body being studied. CT scans provide greater clarity and reveal more details than regular x-ray exams.    Your physician has requested that you have an echocardiogram. Echocardiography is  a painless test that uses sound waves to create images of your heart. It provides your doctor with information about the size and shape of your heart and how well your heart's chambers and valves are working. This procedure takes approximately one hour. There are no restrictions for this procedure.   Follow-Up: At [facility], you and your health needs are our priority.  As part of our continuing mission to provide you with exceptional heart care, we have created designated Provider Care Teams.  These Care Teams include your primary Cardiologist (physician) and Advanced Practice Providers (APPs -  Physician Assistants and Nurse Practitioners) who all work together to provide you with the care you need, when you need it.  We recommend signing up for the patient portal called "MyChart".  Sign up information is provided on this After Visit Summary.  MyChart is used to connect with patients for Virtual Visits (Telemedicine).  Patients are able to view lab/test results, encounter notes, upcoming appointments, etc.  Non-urgent messages can be sent to your provider as well.   To learn more about what you can do with MyChart, go to [email or web address].    Your next appointment:   3 month(s)  The format for your next appointment:   In Person  Provider:   Laurance Flatten, MD   Other Instructions  Echocardiogram An echocardiogram is a test that uses sound waves (ultrasound) to produce images of the heart. Images from an echocardiogram can provide important information about:  Heart size and shape.  The size and thickness and movement of your heart's walls.  Heart muscle function and strength.  Heart valve function or if you have  stenosis. Stenosis is when the heart valves are too narrow.  If blood is flowing backward through the heart valves (regurgitation).  A tumor or infectious growth around the heart valves.  Areas of heart muscle that are not working well because of poor blood flow or injury from a heart attack.  Aneurysm detection. An aneurysm is a weak or damaged part of an artery wall. The wall bulges out from the normal force of blood pumping through the body. Tell a health care provider about:  Any allergies you have.  All medicines you are taking, including vitamins, herbs, eye drops, creams, and over-the-counter medicines.  Any blood disorders you have.  Any surgeries you have had.  Any medical conditions you have.  Whether you are pregnant or may be pregnant. What are the risks? Generally, this is a safe test. However, problems may occur, including an allergic reaction to dye (contrast) that may be used during the test. What happens before the test? No specific preparation is needed. You may eat and drink normally. What happens during the test?  You will take off your clothes from the waist up and put on a hospital gown.  Electrodes or electrocardiogram (ECG)patches may be placed on your chest. The electrodes or patches are then connected to a device that monitors your heart rate and rhythm.  You will lie down on a table for an ultrasound exam. A gel will be applied to your chest to help sound waves pass through your skin.  A handheld device, called a transducer, will be pressed against your chest and moved over your heart. The transducer produces sound waves that travel to your heart and bounce back (or "echo" back) to the transducer. These sound waves will be captured in real-time and changed into images of your heart that can be viewed on a video  monitor. The images will be recorded on a computer and reviewed by your health care provider.  You may be asked to change positions or hold your  breath for a short time. This makes it easier to get different views or better views of your heart.  In some cases, you may receive contrast through an IV in one of your veins. This can improve the quality of the pictures from your heart. The procedure may vary among health care providers and hospitals.   What can I expect after the test? You may return to your normal, everyday life, including diet, activities, and medicines, unless your health care provider tells you not to do that. Follow these instructions at home:  It is up to you to get the results of your test. Ask your health care provider, or the department that is doing the test, when your results will be ready.  Keep all follow-up visits. This is important. Summary  An echocardiogram is a test that uses sound waves (ultrasound) to produce images of the heart.  Images from an echocardiogram can provide important information about the size and shape of your heart, heart muscle function, heart valve function, and other possible heart problems.  You do not need to do anything to prepare before this test. You may eat and drink normally.  After the echocardiogram is completed, you may return to your normal, everyday life, unless your health care provider tells you not to do that. This information is not intended to replace advice given to you by your health care provider. Make sure you discuss any questions you have with your health care provider. Document Revised: 12/16/2019 Document Reviewed: 12/16/2019 Elsevier Patient Education  2021 Elsevier Inc.     Signed, Meriam SpragueHeather E Auda Finfrock, MD  06/29/2020 3:24 PM     [facility]

## 2020-06-29 NOTE — Progress Notes (Signed)
Patient ID: Jacob Barr, male   DOB: [birth year], 70 y.o.   MRN: [id number] Patient enrolled for [employer] to mail a 14 day ZIO XT long term holter monitor to his [address]. Letter with instructions mailed to patient.

## 2020-06-29 NOTE — Patient Instructions (Addendum)
Medication Instructions:  Your physician has recommended you make the following change in your medication:  1.  START Crestor 10 mg taking  1 daily 2.  START Aspirin 81 mg daily   *If you need a refill on your cardiac medications before your next appointment, please call your pharmacy*   Lab Work: None ordered  If you have labs (blood work) drawn today and your tests are completely normal, you will receive your results only by: Marland Kitchen MyChart Message (if you have MyChart) OR . A paper copy in the mail If you have any lab test that is abnormal or we need to change your treatment, we will call you to review the results.   Testing/Procedures: Non-Cardiac CT scanning, (CAT scanning), is a noninvasive, special x-ray that produces cross-sectional images of the body using x-rays and a computer. CT scans help physicians diagnose and treat medical conditions. For some CT exams, a contrast material is used to enhance visibility in the area of the body being studied. CT scans provide greater clarity and reveal more details than regular x-ray exams.    Your physician has requested that you have an echocardiogram. Echocardiography is a painless test that uses sound waves to create images of your heart. It provides your doctor with information about the size and shape of your heart and how well your heart's chambers and valves are working. This procedure takes approximately one hour. There are no restrictions for this procedure.   Follow-Up: At [facility], you and your health needs are our priority.  As part of our continuing mission to provide you with exceptional heart care, we have created designated Provider Care Teams.  These Care Teams include your primary Cardiologist (physician) and Advanced Practice Providers (APPs -  Physician Assistants and Nurse Practitioners) who all work together to provide you with the care you need, when you need it.  We recommend signing up for the patient portal called  "MyChart".  Sign up information is provided on this After Visit Summary.  MyChart is used to connect with patients for Virtual Visits (Telemedicine).  Patients are able to view lab/test results, encounter notes, upcoming appointments, etc.  Non-urgent messages can be sent to your provider as well.   To learn more about what you can do with MyChart, go to [email or web address].    Your next appointment:   3 month(s)  The format for your next appointment:   In Person  Provider:   Laurance Flatten, MD   Other Instructions  Echocardiogram An echocardiogram is a test that uses sound waves (ultrasound) to produce images of the heart. Images from an echocardiogram can provide important information about:  Heart size and shape.  The size and thickness and movement of your heart's walls.  Heart muscle function and strength.  Heart valve function or if you have stenosis. Stenosis is when the heart valves are too narrow.  If blood is flowing backward through the heart valves (regurgitation).  A tumor or infectious growth around the heart valves.  Areas of heart muscle that are not working well because of poor blood flow or injury from a heart attack.  Aneurysm detection. An aneurysm is a weak or damaged part of an artery wall. The wall bulges out from the normal force of blood pumping through the body. Tell a health care provider about:  Any allergies you have.  All medicines you are taking, including vitamins, herbs, eye drops, creams, and over-the-counter medicines.  Any blood disorders you have.  Any  surgeries you have had.  Any medical conditions you have.  Whether you are pregnant or may be pregnant. What are the risks? Generally, this is a safe test. However, problems may occur, including an allergic reaction to dye (contrast) that may be used during the test. What happens before the test? No specific preparation is needed. You may eat and drink normally. What happens  during the test?  You will take off your clothes from the waist up and put on a hospital gown.  Electrodes or electrocardiogram (ECG)patches may be placed on your chest. The electrodes or patches are then connected to a device that monitors your heart rate and rhythm.  You will lie down on a table for an ultrasound exam. A gel will be applied to your chest to help sound waves pass through your skin.  A handheld device, called a transducer, will be pressed against your chest and moved over your heart. The transducer produces sound waves that travel to your heart and bounce back (or "echo" back) to the transducer. These sound waves will be captured in real-time and changed into images of your heart that can be viewed on a video monitor. The images will be recorded on a computer and reviewed by your health care provider.  You may be asked to change positions or hold your breath for a short time. This makes it easier to get different views or better views of your heart.  In some cases, you may receive contrast through an IV in one of your veins. This can improve the quality of the pictures from your heart. The procedure may vary among health care providers and hospitals.   What can I expect after the test? You may return to your normal, everyday life, including diet, activities, and medicines, unless your health care provider tells you not to do that. Follow these instructions at home:  It is up to you to get the results of your test. Ask your health care provider, or the department that is doing the test, when your results will be ready.  Keep all follow-up visits. This is important. Summary  An echocardiogram is a test that uses sound waves (ultrasound) to produce images of the heart.  Images from an echocardiogram can provide important information about the size and shape of your heart, heart muscle function, heart valve function, and other possible heart problems.  You do not need to do  anything to prepare before this test. You may eat and drink normally.  After the echocardiogram is completed, you may return to your normal, everyday life, unless your health care provider tells you not to do that. This information is not intended to replace advice given to you by your health care provider. Make sure you discuss any questions you have with your health care provider. Document Revised: 12/16/2019 Document Reviewed: 12/16/2019 Elsevier Patient Education  2021 [employer].

## 2020-07-03 DIAGNOSIS — R55 Syncope and collapse: Secondary | ICD-10-CM

## 2020-07-06 ENCOUNTER — Inpatient Hospital Stay: Admission: RE | Admit: 2020-07-06 | Payer: Medicare PPO | Source: Ambulatory Visit

## 2020-07-06 ENCOUNTER — Other Ambulatory Visit: Payer: Self-pay | Admitting: Physician Assistant

## 2020-07-06 DIAGNOSIS — I7090 Unspecified atherosclerosis: Secondary | ICD-10-CM

## 2020-07-06 DIAGNOSIS — R1084 Generalized abdominal pain: Secondary | ICD-10-CM

## 2020-07-22 ENCOUNTER — Ambulatory Visit (HOSPITAL_COMMUNITY): Payer: Medicare PPO | Attending: Cardiology

## 2020-07-22 ENCOUNTER — Ambulatory Visit (INDEPENDENT_AMBULATORY_CARE_PROVIDER_SITE_OTHER)
Admission: RE | Admit: 2020-07-22 | Discharge: 2020-07-22 | Disposition: A | Discharge status: Home / Self Care | Payer: Medicare PPO | Source: Ambulatory Visit | Attending: Cardiology | Admitting: Cardiology

## 2020-07-22 ENCOUNTER — Encounter (INDEPENDENT_AMBULATORY_CARE_PROVIDER_SITE_OTHER): Payer: Self-pay

## 2020-07-22 ENCOUNTER — Other Ambulatory Visit: Payer: Self-pay

## 2020-07-22 DIAGNOSIS — R55 Syncope and collapse: Secondary | ICD-10-CM

## 2020-07-22 DIAGNOSIS — Z720 Tobacco use: Secondary | ICD-10-CM | POA: Insufficient documentation

## 2020-07-22 DIAGNOSIS — Z87891 Personal history of nicotine dependence: Secondary | ICD-10-CM | POA: Diagnosis not present

## 2020-07-22 LAB — ECHOCARDIOGRAM COMPLETE
Area-P 1/2: 3.08 cm2
S' Lateral: 2.7 cm

## 2020-07-23 ENCOUNTER — Ambulatory Visit
Admission: RE | Admit: 2020-07-23 | Discharge: 2020-07-23 | Disposition: A | Discharge status: Home / Self Care | Payer: Medicare PPO | Source: Ambulatory Visit | Attending: Physician Assistant | Admitting: Physician Assistant

## 2020-07-23 DIAGNOSIS — I7090 Unspecified atherosclerosis: Secondary | ICD-10-CM

## 2020-07-23 DIAGNOSIS — R1084 Generalized abdominal pain: Secondary | ICD-10-CM

## 2020-07-23 MED ORDER — IOPAMIDOL (ISOVUE-370) INJECTION 76%
75.0000 mL | Freq: Once | INTRAVENOUS | Status: AC | PRN
  Administered 2020-07-23: 75 mL via INTRAVENOUS

## 2020-08-10 ENCOUNTER — Encounter (HOSPITAL_BASED_OUTPATIENT_CLINIC_OR_DEPARTMENT_OTHER): Payer: Self-pay

## 2020-08-10 ENCOUNTER — Other Ambulatory Visit: Payer: Self-pay

## 2020-08-10 ENCOUNTER — Emergency Department (HOSPITAL_BASED_OUTPATIENT_CLINIC_OR_DEPARTMENT_OTHER)
Admission: EM | Admit: 2020-08-10 | Discharge: 2020-08-10 | Disposition: A | Discharge status: Home / Self Care | Payer: Medicare PPO | Attending: Emergency Medicine | Admitting: Emergency Medicine

## 2020-08-10 DIAGNOSIS — E871 Hypo-osmolality and hyponatremia: Secondary | ICD-10-CM | POA: Insufficient documentation

## 2020-08-10 DIAGNOSIS — J449 Chronic obstructive pulmonary disease, unspecified: Secondary | ICD-10-CM | POA: Diagnosis not present

## 2020-08-10 DIAGNOSIS — R109 Unspecified abdominal pain: Secondary | ICD-10-CM | POA: Insufficient documentation

## 2020-08-10 DIAGNOSIS — Z7982 Long term (current) use of aspirin: Secondary | ICD-10-CM | POA: Diagnosis not present

## 2020-08-10 DIAGNOSIS — F17210 Nicotine dependence, cigarettes, uncomplicated: Secondary | ICD-10-CM | POA: Insufficient documentation

## 2020-08-10 LAB — COMPREHENSIVE METABOLIC PANEL
ALT: 21 U/L (ref 0–44)
AST: 20 U/L (ref 15–41)
Albumin: 4.1 g/dL (ref 3.5–5.0)
Alkaline Phosphatase: 88 U/L (ref 38–126)
Anion gap: 7 (ref 5–15)
BUN: 13 mg/dL (ref 8–23)
CO2: 25 mmol/L (ref 22–32)
Calcium: 8.6 mg/dL — ABNORMAL LOW (ref 8.9–10.3)
Chloride: 91 mmol/L — ABNORMAL LOW (ref 98–111)
Creatinine, Ser: 1 mg/dL (ref 0.61–1.24)
GFR, Estimated: >60 mL/min (ref >60)
Glucose, Bld: 121 mg/dL — ABNORMAL HIGH (ref 70–99)
Potassium: 3.4 mmol/L — ABNORMAL LOW (ref 3.5–5.1)
Sodium: 123 mmol/L — ABNORMAL LOW (ref 135–145)
Total Bilirubin: 0.4 mg/dL (ref 0.3–1.2)
Total Protein: 6.6 g/dL (ref 6.5–8.1)

## 2020-08-10 LAB — CBC WITH DIFFERENTIAL/PLATELET
Abs Immature Granulocytes: 0.01 10*3/uL (ref 0.00–0.07)
Basophils Absolute: 0 10*3/uL (ref 0.0–0.1)
Basophils Relative: 0 %
Eosinophils Absolute: 0.1 10*3/uL (ref 0.0–0.5)
Eosinophils Relative: 1 %
HCT: 36 % — ABNORMAL LOW (ref 39.0–52.0)
Hemoglobin: 13.2 g/dL (ref 13.0–17.0)
Immature Granulocytes: 0 %
Lymphocytes Relative: 31 %
Lymphs Abs: 3 10*3/uL (ref 0.7–4.0)
MCH: 32.4 pg (ref 26.0–34.0)
MCHC: 36.7 g/dL — ABNORMAL HIGH (ref 30.0–36.0)
MCV: 88.2 fL (ref 80.0–100.0)
Monocytes Absolute: 0.9 10*3/uL (ref 0.1–1.0)
Monocytes Relative: 10 %
Neutro Abs: 5.6 10*3/uL (ref 1.7–7.7)
Neutrophils Relative %: 58 %
Platelets: 232 10*3/uL (ref 150–400)
RBC: 4.08 MIL/uL — ABNORMAL LOW (ref 4.22–5.81)
RDW: 12.2 % (ref 11.5–15.5)
WBC: 9.7 10*3/uL (ref 4.0–10.5)
nRBC: 0 % (ref 0.0–0.2)

## 2020-08-10 LAB — URINALYSIS, ROUTINE W REFLEX MICROSCOPIC
Bilirubin Urine: NEGATIVE
Glucose, UA: NEGATIVE mg/dL
Hgb urine dipstick: NEGATIVE
Ketones, ur: NEGATIVE mg/dL
Leukocytes,Ua: NEGATIVE
Nitrite: NEGATIVE
Specific Gravity, Urine: 1.008 (ref 1.005–1.030)
pH: 7 (ref 5.0–8.0)

## 2020-08-10 LAB — LIPASE, BLOOD: Lipase: 27 U/L (ref 11–51)

## 2020-08-10 MED ORDER — SODIUM CHLORIDE 0.9 % IV BOLUS
1000.0000 mL | Freq: Once | INTRAVENOUS | Status: AC
  Administered 2020-08-10: 1000 mL via INTRAVENOUS

## 2020-08-10 NOTE — Discharge Instructions (Signed)
Follow-up with your doctors.  Your sodium was 123 today.  You need to follow-up to make sure it improves.  Continue to follow with your doctors for your abdominal pain.

## 2020-08-10 NOTE — ED Provider Notes (Signed)
[facility] EMERGENCY DEPT Provider Note   CSN: [id number] Arrival date & time: 08/10/20  2052     History Chief Complaint  Patient presents with  . Abdominal Pain  . Back Pain    Jacob Barr is a 70 y.o. male.  HPI Patient presents with abdominal pain.  Has had for months now.  Has been seen in the ER, through PCP, through cardiology, and through gastroenterology.  No clear cause been found.  Has had CT scans that have been reassuring except for a small AAA.  No dysuria.  States the pain does come and go somewhat.  States he has had some episodes where he is felt lightheaded with it.  No dysuria.  No nausea vomiting or diarrhea.  Not associate with eating.    Past Medical History:  Diagnosis Date  . COPD (chronic obstructive pulmonary disease) (HCC)   . Depression   . Emphysema   . Tobacco abuse     Patient Active Problem List   Diagnosis Date Noted  . Constipation, chronic 07/03/2011  . Abdominal pain 07/03/2011  . Tobacco abuse 07/02/2011  . Chest pain 07/02/2011  . Hx of hemorrhoids 07/02/2011  . AKI (acute kidney injury) (HCC) 07/02/2011    Past Surgical History:  Procedure Laterality Date  . HEMORRHOID SURGERY    . HEMORRHOID SURGERY  2008       History reviewed. No pertinent family history.  Social History   Tobacco Use  . Smoking status: Current Every Day Smoker    Packs/day: 1.50    Years: 50.00    Pack years: 75.00    Types: Cigarettes  . Smokeless tobacco: Never Used  Vaping Use  . Vaping Use: Never used  Substance Use Topics  . Alcohol use: No  . Drug use: No    Home Medications Prior to Admission medications   Medication Sig Start Date End Date Taking? Authorizing Provider  albuterol (VENTOLIN HFA) 108 (90 Base) MCG/ACT inhaler Inhale 1-2 puffs into the lungs every 6 (six) hours as needed for wheezing or shortness of breath. 06/13/20   Wieters, Hallie C, PA-C  aspirin EC 81 MG tablet Take 1 tablet (81 mg total) by mouth  daily. Swallow whole. 06/29/20   Meriam Sprague, MD  fluticasone (FLONASE) 50 MCG/ACT nasal spray Place 1-2 sprays into both nostrils daily. Patient taking differently: Place 1-2 sprays into both nostrils as needed. 06/13/20   Wieters, Hallie C, PA-C  hyoscyamine (LEVBID) 0.375 MG 12 hr tablet Take 0.375 mg by mouth daily. Stomach spasms    [provider]  pantoprazole (PROTONIX) 20 MG tablet Take 1 tablet (20 mg total) by mouth daily. 06/20/20   Jacalyn Lefevre, MD  rosuvastatin (CRESTOR) 10 MG tablet Take 1 tablet (10 mg total) by mouth daily. 06/29/20 09/27/20  Meriam Sprague, MD    Allergies    Patient has no known allergies.  Review of Systems   Review of Systems  Constitutional: Negative for appetite change.  HENT: Negative for congestion.   Respiratory: Negative for shortness of breath.   Gastrointestinal: Positive for abdominal pain. Negative for diarrhea, nausea and vomiting.  Genitourinary: Negative for flank pain.  Musculoskeletal: Positive for back pain.  Skin: Negative for rash.  Neurological: Negative for weakness.  Psychiatric/Behavioral: Negative for confusion.    Physical Exam Updated Vital Signs BP (!) 180/96   Pulse 71   Temp 98.1 F (36.7 C) (Oral)   Resp 18   Ht 5\' 6"  (1.676 m)  Wt 69.9 kg   SpO2 100%   BMI 24.87 kg/m   Physical Exam Vitals and nursing note reviewed.  Constitutional:      Appearance: He is well-developed.  HENT:     Head: Normocephalic.  Eyes:     Pupils: Pupils are equal, round, and reactive to light.  Cardiovascular:     Rate and Rhythm: Normal rate and regular rhythm.  Chest:     Chest wall: No tenderness.  Abdominal:     Tenderness: There is no abdominal tenderness.     Hernia: No hernia is present.  Skin:    General: Skin is warm.     Capillary Refill: Capillary refill takes less than 2 seconds.  Neurological:     Mental Status: He is alert and oriented to person, place, and time.  Psychiatric:         Behavior: Behavior normal.     ED Results / Procedures / Treatments   Labs (all labs ordered are listed, but only abnormal results are displayed) Labs Reviewed  COMPREHENSIVE METABOLIC PANEL - Abnormal; Notable for the following components:      Result Value   Sodium 123 (*)    Potassium 3.4 (*)    Chloride 91 (*)    Glucose, Bld 121 (*)    Calcium 8.6 (*)    All other components within normal limits  CBC WITH DIFFERENTIAL/PLATELET - Abnormal; Notable for the following components:   RBC 4.08 (*)    HCT 36.0 (*)    MCHC 36.7 (*)    All other components within normal limits  URINALYSIS, ROUTINE W REFLEX MICROSCOPIC - Abnormal; Notable for the following components:   Color, Urine COLORLESS (*)    Protein, ur TRACE (*)    All other components within normal limits  LIPASE, BLOOD    EKG None  Radiology No results found.  Procedures Procedures   Medications Ordered in ED Medications  sodium chloride 0.9 % bolus 1,000 mL (1,000 mLs Intravenous New Bag/Given 08/10/20 2212)    ED Course  I have reviewed the triage vital signs and the nursing notes.  Pertinent labs & imaging results that were available during my care of the patient were reviewed by me and considered in my medical decision making (see chart for details).    MDM Rules/Calculators/A&P                          Patient with abdominal pain.  Has been chronic.  Has had extensive work-up and including CAT scan.  Reportedly is scheduled to have a colonoscopy in 2 months.  Continued pain.  Work-up today is overall reassuring although does have worsening hyponatremia.  History of chronic hyponatremia with sodium appears to run around 128.  Down to 123 today.  Will supplement with IV fluids.  However do not think we need another CT scan.  Recent ones reviewed.  Has small AAA.  Approximately 3.4 cm.  Unlikely to rupture at this diameter.  Continue follow-up with PCP and potentially cardiology for the pain and electrolyte  abnormalities. Final Clinical Impression(s) / ED Diagnoses Final diagnoses:  Abdominal pain, unspecified abdominal location  Hyponatremia    Rx / DC Orders ED Discharge Orders    None       Benjiman Core, MD 08/10/20 2238

## 2020-08-10 NOTE — ED Triage Notes (Signed)
Patient reports having ongoing pain in his abdomen, lower back and hips.

## 2020-09-01 ENCOUNTER — Other Ambulatory Visit: Payer: Self-pay | Admitting: Neurosurgery

## 2020-09-01 DIAGNOSIS — M5440 Lumbago with sciatica, unspecified side: Secondary | ICD-10-CM

## 2020-09-05 ENCOUNTER — Other Ambulatory Visit: Payer: Self-pay

## 2020-09-05 ENCOUNTER — Ambulatory Visit
Admission: RE | Admit: 2020-09-05 | Discharge: 2020-09-05 | Disposition: A | Discharge status: Home / Self Care | Payer: Medicare PPO | Source: Ambulatory Visit | Attending: Neurosurgery | Admitting: Neurosurgery

## 2020-09-05 DIAGNOSIS — M5440 Lumbago with sciatica, unspecified side: Secondary | ICD-10-CM

## 2020-10-01 ENCOUNTER — Ambulatory Visit: Payer: Medicare PPO | Admitting: Cardiology

## 2020-10-18 ENCOUNTER — Telehealth: Payer: Medicare PPO | Admitting: Family

## 2020-12-15 ENCOUNTER — Ambulatory Visit
Admission: EM | Admit: 2020-12-15 | Discharge: 2020-12-15 | Disposition: A | Discharge status: Home / Self Care | Payer: Medicare PPO | Attending: Urgent Care | Admitting: Urgent Care

## 2020-12-15 ENCOUNTER — Other Ambulatory Visit: Payer: Self-pay

## 2020-12-15 DIAGNOSIS — M5136 Other intervertebral disc degeneration, lumbar region: Secondary | ICD-10-CM

## 2020-12-15 DIAGNOSIS — M5126 Other intervertebral disc displacement, lumbar region: Secondary | ICD-10-CM

## 2020-12-15 DIAGNOSIS — G629 Polyneuropathy, unspecified: Secondary | ICD-10-CM

## 2020-12-15 MED ORDER — GABAPENTIN 300 MG PO CAPS: 300.0000 mg | ORAL_CAPSULE | Freq: Two times a day (BID) | ORAL | 0 refills | Status: DC

## 2020-12-15 NOTE — ED Provider Notes (Signed)
[facility]   MRN: [id number] DOB: 03/13/1951  Subjective:   Jacob Barr is a 70 y.o. male presenting for 6 month history of persistent and chronic numbness and tingling of different portions of his buttocks, posterior thighs and also bilateral feet.  Patient has been working with with the neurologist on this and has had an MRI done.  They have not recommended surgery.  He also has a PCP.  Has a history of smoking, been recommended to see a vascular and vein specialist but has not done so.  Denies any new falls, traumas, weakness, changes to bowel or urinary habits.  No current facility-administered medications for this encounter.  Current Outpatient Medications:    albuterol (VENTOLIN HFA) 108 (90 Base) MCG/ACT inhaler, Inhale 1-2 puffs into the lungs every 6 (six) hours as needed for wheezing or shortness of breath., Disp: 18 g, Rfl: 0   aspirin EC 81 MG tablet, Take 1 tablet (81 mg total) by mouth daily. Swallow whole., Disp: 90 tablet, Rfl: 3   fluticasone (FLONASE) 50 MCG/ACT nasal spray, Place 1-2 sprays into both nostrils daily. (Patient taking differently: Place 1-2 sprays into both nostrils as needed.), Disp: 16 g, Rfl: 0   hyoscyamine (LEVBID) 0.375 MG 12 hr tablet, Take 0.375 mg by mouth daily. Stomach spasms, Disp: , Rfl:    pantoprazole (PROTONIX) 20 MG tablet, Take 1 tablet (20 mg total) by mouth daily., Disp: 30 tablet, Rfl: 0   rosuvastatin (CRESTOR) 10 MG tablet, Take 1 tablet (10 mg total) by mouth daily., Disp: 90 tablet, Rfl: 3   No Known Allergies  Past Medical History:  Diagnosis Date   COPD (chronic obstructive pulmonary disease) (HCC)    Depression    Emphysema    Tobacco abuse      Past Surgical History:  Procedure Laterality Date   HEMORRHOID SURGERY     HEMORRHOID SURGERY  2008    History reviewed. No pertinent family history.  Social History   Tobacco Use   Smoking status: Every Day    Packs/day: 1.50    Years: 50.00    Pack  years: 75.00    Types: Cigarettes   Smokeless tobacco: Never  Vaping Use   Vaping Use: Never used  Substance Use Topics   Alcohol use: No   Drug use: No    ROS   Objective:   Vitals: BP (!) 148/89 (BP Location: Left Arm)   Pulse 65   Temp 98.2 F (36.8 C) (Oral)   Resp 18   SpO2 99%   Physical Exam Constitutional:      General: He is not in acute distress.    Appearance: Normal appearance. He is well-developed and normal weight. He is not ill-appearing, toxic-appearing or diaphoretic.  HENT:     Head: Normocephalic and atraumatic.     Right Ear: External ear normal.     Left Ear: External ear normal.     Nose: Nose normal.     Mouth/Throat:     Pharynx: Oropharynx is clear.  Eyes:     General: No scleral icterus.       Right eye: No discharge.        Left eye: No discharge.     Extraocular Movements: Extraocular movements intact.     Pupils: Pupils are equal, round, and reactive to light.  Cardiovascular:     Rate and Rhythm: Normal rate.  Pulmonary:     Effort: Pulmonary effort is normal.  Musculoskeletal:  Cervical back: Normal range of motion.  Neurological:     Mental Status: He is alert and oriented to person, place, and time.  Psychiatric:        Mood and Affect: Mood normal.        Behavior: Behavior normal.        Thought Content: Thought content normal.        Judgment: Judgment normal.     Narrative & Impression  CLINICAL DATA:  Low back pain with bilateral foot numbness.   EXAM: MRI LUMBAR SPINE WITHOUT CONTRAST   TECHNIQUE: Multiplanar, multisequence MR imaging of the lumbar spine was performed. No intravenous contrast was administered.   COMPARISON:  MRI lumbar spine Sep 25, 2011.   FINDINGS: Segmentation: Standard segmentation is assumed. The inferior-most fully formed intervertebral disc is labeled L5-S1.   Alignment:  No substantial sagittal subluxation.   Vertebrae: No specific evidence of acute fracture  or discitis/osteomyelitis. Mild heterogeneity of the bone marrow without suspicious bone lesion.   Conus medullaris and cauda equina: Conus extends to the L1 level. Conus appears normal.   Paraspinal and other soft tissues: Bilateral renal cyst, partially imaged and suboptimally evaluated.   Disc levels:   T12-L1: No significant disc protrusion, foraminal stenosis, or canal stenosis.   L1-L2: No significant disc protrusion, foraminal stenosis, or canal stenosis.   L2-L3: No significant disc protrusion, foraminal stenosis, or canal stenosis.   L3-L4: Disc desiccation and mild height loss. Similar left foraminal/extraforaminal disc protrusion with annular fissure which contacts the exiting left L3 nerve (series 6, image 27) with overall mild left foraminal stenosis. No significant canal or right foraminal stenosis.   L4-L5: Mild disc desiccation. Mild broad disc bulging without significant canal or foraminal stenosis.   L5-S1: Disc desiccation. Similar small central disc protrusion with annular fissure. No significant canal or foraminal stenosis. Incidental conjoined right L5 and S1 root sleeves.   IMPRESSION: 1. At L3-L4, similar left foraminal/extraforaminal disc protrusion which contacts the exiting left L3 nerve with overall mild left foraminal stenosis. 2. At L5-S1, similar mild disc bulge with annular fissure without significant canal or foraminal stenosis.     Electronically Signed   By: Feliberto Harts MD   On: 09/06/2020 08:31    Assessment and Plan :   PDMP not reviewed this encounter.  1. Neuropathy   2. Bulging lumbar disc     Recommended gabapentin for neuropathic pain. Follow up with neurologist. As there is no new trauma, fall, will defer imaging. Follow up with PCP for referral to vascular and vein specialist. Counseled patient on potential for adverse effects with medications prescribed/recommended today, ER and return-to-clinic precautions  discussed, patient verbalized understanding.    Wallis Bamberg, PA-C 12/15/20 1220

## 2020-12-15 NOTE — ED Triage Notes (Signed)
Pt c/o bilateral lower extrimity numbness starting approx 6 months ago. States has seen a neurologist and had a unremarkable MRI but would still like to understand what is causing this. States it is not painful but the sensation keeps him up at night. Is able to feel pressure when touching legs but feels an underlying numbness with it.

## 2021-01-01 ENCOUNTER — Emergency Department (HOSPITAL_BASED_OUTPATIENT_CLINIC_OR_DEPARTMENT_OTHER): Payer: Medicare PPO

## 2021-01-01 ENCOUNTER — Emergency Department (HOSPITAL_BASED_OUTPATIENT_CLINIC_OR_DEPARTMENT_OTHER)
Admission: EM | Admit: 2021-01-01 | Discharge: 2021-01-01 | Disposition: A | Discharge status: Home / Self Care | Payer: Medicare PPO | Attending: Emergency Medicine | Admitting: Emergency Medicine

## 2021-01-01 ENCOUNTER — Encounter (HOSPITAL_BASED_OUTPATIENT_CLINIC_OR_DEPARTMENT_OTHER): Payer: Self-pay

## 2021-01-01 ENCOUNTER — Other Ambulatory Visit: Payer: Self-pay

## 2021-01-01 DIAGNOSIS — K5900 Constipation, unspecified: Secondary | ICD-10-CM

## 2021-01-01 DIAGNOSIS — R1030 Lower abdominal pain, unspecified: Secondary | ICD-10-CM | POA: Diagnosis not present

## 2021-01-01 DIAGNOSIS — M545 Low back pain, unspecified: Secondary | ICD-10-CM | POA: Diagnosis not present

## 2021-01-01 DIAGNOSIS — Z7951 Long term (current) use of inhaled steroids: Secondary | ICD-10-CM | POA: Diagnosis not present

## 2021-01-01 DIAGNOSIS — F17210 Nicotine dependence, cigarettes, uncomplicated: Secondary | ICD-10-CM | POA: Diagnosis not present

## 2021-01-01 DIAGNOSIS — J449 Chronic obstructive pulmonary disease, unspecified: Secondary | ICD-10-CM | POA: Insufficient documentation

## 2021-01-01 DIAGNOSIS — G8929 Other chronic pain: Secondary | ICD-10-CM | POA: Diagnosis not present

## 2021-01-01 DIAGNOSIS — Z7982 Long term (current) use of aspirin: Secondary | ICD-10-CM | POA: Insufficient documentation

## 2021-01-01 DIAGNOSIS — R202 Paresthesia of skin: Secondary | ICD-10-CM | POA: Diagnosis present

## 2021-01-01 DIAGNOSIS — E860 Dehydration: Secondary | ICD-10-CM

## 2021-01-01 DIAGNOSIS — G629 Polyneuropathy, unspecified: Secondary | ICD-10-CM | POA: Insufficient documentation

## 2021-01-01 DIAGNOSIS — E871 Hypo-osmolality and hyponatremia: Secondary | ICD-10-CM

## 2021-01-01 DIAGNOSIS — G6289 Other specified polyneuropathies: Secondary | ICD-10-CM

## 2021-01-01 LAB — CBC WITH DIFFERENTIAL/PLATELET
Abs Immature Granulocytes: 0.02 10*3/uL (ref 0.00–0.07)
Basophils Absolute: 0 10*3/uL (ref 0.0–0.1)
Basophils Relative: 1 %
Eosinophils Absolute: 0 10*3/uL (ref 0.0–0.5)
Eosinophils Relative: 1 %
HCT: 37.5 % — ABNORMAL LOW (ref 39.0–52.0)
Hemoglobin: 13.1 g/dL (ref 13.0–17.0)
Immature Granulocytes: 0 %
Lymphocytes Relative: 25 %
Lymphs Abs: 1.6 10*3/uL (ref 0.7–4.0)
MCH: 31.6 pg (ref 26.0–34.0)
MCHC: 34.9 g/dL (ref 30.0–36.0)
MCV: 90.4 fL (ref 80.0–100.0)
Monocytes Absolute: 0.5 10*3/uL (ref 0.1–1.0)
Monocytes Relative: 9 %
Neutro Abs: 4.1 10*3/uL (ref 1.7–7.7)
Neutrophils Relative %: 64 %
Platelets: 247 10*3/uL (ref 150–400)
RBC: 4.15 MIL/uL — ABNORMAL LOW (ref 4.22–5.81)
RDW: 13.2 % (ref 11.5–15.5)
WBC: 6.3 10*3/uL (ref 4.0–10.5)
nRBC: 0 % (ref 0.0–0.2)

## 2021-01-01 LAB — URINALYSIS, ROUTINE W REFLEX MICROSCOPIC
Bilirubin Urine: NEGATIVE
Glucose, UA: NEGATIVE mg/dL
Hgb urine dipstick: NEGATIVE
Ketones, ur: NEGATIVE mg/dL
Leukocytes,Ua: NEGATIVE
Nitrite: NEGATIVE
Specific Gravity, Urine: 1.016 (ref 1.005–1.030)
pH: 6.5 (ref 5.0–8.0)

## 2021-01-01 LAB — COMPREHENSIVE METABOLIC PANEL
ALT: 13 U/L (ref 0–44)
AST: 19 U/L (ref 15–41)
Albumin: 4 g/dL (ref 3.5–5.0)
Alkaline Phosphatase: 82 U/L (ref 38–126)
Anion gap: 9 (ref 5–15)
BUN: 15 mg/dL (ref 8–23)
CO2: 22 mmol/L (ref 22–32)
Calcium: 9.1 mg/dL (ref 8.9–10.3)
Chloride: 95 mmol/L — ABNORMAL LOW (ref 98–111)
Creatinine, Ser: 1.51 mg/dL — ABNORMAL HIGH (ref 0.61–1.24)
GFR, Estimated: 49 mL/min — ABNORMAL LOW (ref >60)
Glucose, Bld: 100 mg/dL — ABNORMAL HIGH (ref 70–99)
Potassium: 4.2 mmol/L (ref 3.5–5.1)
Sodium: 126 mmol/L — ABNORMAL LOW (ref 135–145)
Total Bilirubin: 0.5 mg/dL (ref 0.3–1.2)
Total Protein: 6.9 g/dL (ref 6.5–8.1)

## 2021-01-01 LAB — LIPASE, BLOOD: Lipase: 17 U/L (ref 11–51)

## 2021-01-01 MED ORDER — IOHEXOL 350 MG/ML SOLN
50.0000 mL | Freq: Once | INTRAVENOUS | Status: AC | PRN
  Administered 2021-01-01: 50 mL via INTRAVENOUS

## 2021-01-01 NOTE — ED Triage Notes (Signed)
Multiple complaints including abdominal pain ongoing >6 months, bilateral lower extremity numbness and "blackout spells", all of which patient has been seen for including f/u with neurology, cardiology, and urology.

## 2021-01-01 NOTE — Discharge Instructions (Addendum)
Follow-up with a primary care doctor.  Your sodium was a little low and your creatinine was a little above the baseline.  Continue with the other instructions you have been given by the specialist such as urology.

## 2021-01-01 NOTE — ED Notes (Signed)
Patient transported to CT 

## 2021-01-01 NOTE — ED Notes (Signed)
Pt dc home. With wife. Pt voiced understanding of dc instructions.

## 2021-01-01 NOTE — ED Provider Notes (Signed)
  Physical Exam  BP 135/76   Pulse 60   Temp 98.2 F (36.8 C)   Resp 20   Ht 5\' 6"  (1.676 m)   Wt 70 kg   SpO2 97%   BMI 24.91 kg/m   Physical Exam  ED Course/Procedures     Procedures  MDM  Received patient in signout.  Abdominal pain.  Hip pain.  Back pain.  Has been seen for same by multiple specialties.  No clear cause found.  Found to have a mild hyponatremia here.  Mild elevation in creatinine.  CT scan done and showed likely some constipation.  Possible enteritis.  We will start patient back on his MiraLAX.  We will follow-up with the PCP short-term.  Reportedly seen urology and needs pelvic floor PT.  Hyponatremia appears to be near his baseline.  Will discharge home.       , MD 01/01/21 1721

## 2021-01-01 NOTE — ED Provider Notes (Signed)
[facility] EMERGENCY DEPT Provider Note   CSN: [id number] Arrival date & time: 01/01/21  1355     History Chief Complaint  Patient presents with   multiple complaints    Jacob Barr is a 70 y.o. male.  70 yo M with a chief complaints of numbness to his feet low back pain lower abdominal pain urinary discomfort.  Is been going on for at least 6 months.  The patient is seen a urologist and neurologist multiple ED visits.  He has had an MRI of his back as well as CT imaging of his abdomen pelvis including an angiogram to evaluate for his abdominal aorta.  He is back because his symptoms have persisted and he thinks they are mildly worse.  He denies any chest pain or pressure.  He fell about 2 or 3 weeks ago and does not feel like he has had imaging since.  He is wondering if he has a new injury after his fall.  He would like to have a diagnosis for this.  He tells me that he does not have a family doctor.  The history is provided by the patient.  Illness Severity:  Moderate Onset quality:  Gradual Duration:  6 months Timing:  Constant Progression:  Worsening Chronicity:  New Associated symptoms: abdominal pain   Associated symptoms: no chest pain, no congestion, no diarrhea, no fever, no headaches, no myalgias, no rash, no shortness of breath and no vomiting       Past Medical History:  Diagnosis Date   COPD (chronic obstructive pulmonary disease) (HCC)    Depression    Emphysema    Tobacco abuse     Patient Active Problem List   Diagnosis Date Noted   Constipation, chronic 07/03/2011   Abdominal pain 07/03/2011   Tobacco abuse 07/02/2011   Chest pain 07/02/2011   Hx of hemorrhoids 07/02/2011   AKI (acute kidney injury) (HCC) 07/02/2011    Past Surgical History:  Procedure Laterality Date   HEMORRHOID SURGERY     HEMORRHOID SURGERY  2008       History reviewed. No pertinent family history.  Social History   Tobacco Use   Smoking status:  Every Day    Packs/day: 1.50    Years: 50.00    Pack years: 75.00    Types: Cigarettes   Smokeless tobacco: Never  Vaping Use   Vaping Use: Never used  Substance Use Topics   Alcohol use: No   Drug use: No    Home Medications Prior to Admission medications   Medication Sig Start Date End Date Taking? Authorizing Provider  sulfamethoxazole-trimethoprim (BACTRIM DS) 800-160 MG tablet Take 1 tablet by mouth 2 (two) times daily. Started 1 week ago,stay on it for 30 days   Yes [provider]  albuterol (VENTOLIN HFA) 108 (90 Base) MCG/ACT inhaler Inhale 1-2 puffs into the lungs every 6 (six) hours as needed for wheezing or shortness of breath. 06/13/20   Wieters, Hallie C, PA-C  aspirin EC 81 MG tablet Take 1 tablet (81 mg total) by mouth daily. Swallow whole. 06/29/20   Meriam Sprague, MD  fluticasone (FLONASE) 50 MCG/ACT nasal spray Place 1-2 sprays into both nostrils daily. Patient taking differently: Place 1-2 sprays into both nostrils as needed. 06/13/20   Wieters, Hallie C, PA-C  gabapentin (NEURONTIN) 300 MG capsule Take 1 capsule (300 mg total) by mouth 2 (two) times daily. 12/15/20   Wallis Bamberg, PA-C  hyoscyamine (LEVBID) 0.375 MG 12 hr  tablet Take 0.375 mg by mouth daily. Stomach spasms    [provider]  pantoprazole (PROTONIX) 20 MG tablet Take 1 tablet (20 mg total) by mouth daily. 06/20/20   Jacalyn Lefevre, MD  rosuvastatin (CRESTOR) 10 MG tablet Take 1 tablet (10 mg total) by mouth daily. 06/29/20 09/27/20  Meriam Sprague, MD    Allergies    Patient has no known allergies.  Review of Systems   Review of Systems  Constitutional:  Negative for chills and fever.  HENT:  Negative for congestion and facial swelling.   Eyes:  Negative for discharge and visual disturbance.  Respiratory:  Negative for shortness of breath.   Cardiovascular:  Negative for chest pain and palpitations.  Gastrointestinal:  Positive for abdominal pain. Negative for diarrhea  and vomiting.  Musculoskeletal:  Negative for arthralgias and myalgias.  Skin:  Negative for color change and rash.  Neurological:  Negative for tremors, syncope and headaches.  Psychiatric/Behavioral:  Negative for confusion and dysphoric mood.    Physical Exam Updated Vital Signs BP (!) 151/98 (BP Location: Right Arm)   Pulse 93   Temp 98.2 F (36.8 C)   Resp 18   Ht 5\' 6"  (1.676 m)   Wt 70 kg   SpO2 100%   BMI 24.91 kg/m   Physical Exam Vitals and nursing note reviewed.  Constitutional:      Appearance: He is well-developed.  HENT:     Head: Normocephalic and atraumatic.  Eyes:     Pupils: Pupils are equal, round, and reactive to light.  Neck:     Vascular: No JVD.  Cardiovascular:     Rate and Rhythm: Normal rate and regular rhythm.     Heart sounds: No murmur heard.   No friction rub. No gallop.  Pulmonary:     Effort: No respiratory distress.     Breath sounds: No wheezing.  Abdominal:     General: There is no distension.     Tenderness: There is no abdominal tenderness. There is no guarding or rebound.  Musculoskeletal:        General: Normal range of motion.     Cervical back: Normal range of motion and neck supple.     Comments: No spinal tenderness step-offs or deformities.  Pulse motor and sensation intact to bilateral lower extremities.  Reflexes are 2+ and equal.  There is no clonus.  Skin:    Coloration: Skin is not pale.     Findings: No rash.  Neurological:     Mental Status: He is alert and oriented to person, place, and time.  Psychiatric:        Behavior: Behavior normal.    ED Results / Procedures / Treatments   Labs (all labs ordered are listed, but only abnormal results are displayed) Labs Reviewed  URINE CULTURE  URINALYSIS, ROUTINE W REFLEX MICROSCOPIC  CBC WITH DIFFERENTIAL/PLATELET  COMPREHENSIVE METABOLIC PANEL  LIPASE, BLOOD    EKG None  Radiology No results found.  Procedures Procedures   Medications Ordered in  ED Medications - No data to display  ED Course  I have reviewed the triage vital signs and the nursing notes.  Pertinent labs & imaging results that were available during my care of the patient were reviewed by me and considered in my medical decision making (see chart for details).    MDM Rules/Calculators/A&P  70 yo M with a chief complaints of abdominal discomfort and low back pain and tingling to his feet.  Patient has been seen as an outpatient for this by multiple specialist.  Found to have pelvic floor dysfunction as well as a peripheral neuropathy.  I discussed these results with the patient.  He is concerned that he has a new injury after falling about 3 to 4 weeks ago.  We will obtain CT imaging here.  Blood work.  PCP follow-up.  The patients results and plan were reviewed and discussed.   Any x-rays performed were independently reviewed by myself.   Differential diagnosis were considered with the presenting HPI.  Medications - No data to display  Vitals:   01/01/21 1421 01/01/21 1422 01/01/21 1424  BP: (!) 151/98    Pulse: 93    Resp: 18    Temp:   98.2 F (36.8 C)  SpO2: 100%    Weight:  70 kg   Height:  5\' 6"  (1.676 m)     Final diagnoses:  Other polyneuropathy  Chronic bilateral low back pain without sciatica     Medications given during this visit Medications - No data to display   The patient appears reasonably screen and/or stabilized for discharge and I doubt any other medical condition or other [facility] requiring further screening, evaluation, or treatment in the ED at this time prior to discharge.   Final Clinical Impression(s) / ED Diagnoses Final diagnoses:  Other polyneuropathy  Chronic bilateral low back pain without sciatica    Rx / DC Orders ED Discharge Orders     None        [facility], DO 01/01/21 1454

## 2021-01-02 LAB — URINE CULTURE: Culture: NO GROWTH

## 2021-01-20 ENCOUNTER — Other Ambulatory Visit (HOSPITAL_COMMUNITY): Payer: Self-pay | Admitting: Urgent Care

## 2021-01-20 ENCOUNTER — Telehealth: Payer: Self-pay | Admitting: Radiology

## 2021-01-21 DIAGNOSIS — M545 Low back pain, unspecified: Secondary | ICD-10-CM | POA: Insufficient documentation

## 2021-01-21 DIAGNOSIS — I714 Abdominal aortic aneurysm, without rupture, unspecified: Secondary | ICD-10-CM | POA: Insufficient documentation

## 2021-01-21 DIAGNOSIS — E871 Hypo-osmolality and hyponatremia: Secondary | ICD-10-CM | POA: Insufficient documentation

## 2021-01-21 DIAGNOSIS — L538 Other specified erythematous conditions: Secondary | ICD-10-CM | POA: Insufficient documentation

## 2021-01-21 DIAGNOSIS — N343 Urethral syndrome, unspecified: Secondary | ICD-10-CM | POA: Insufficient documentation

## 2021-01-25 ENCOUNTER — Other Ambulatory Visit: Payer: Self-pay | Admitting: Urgent Care

## 2021-01-25 ENCOUNTER — Other Ambulatory Visit (HOSPITAL_COMMUNITY): Payer: Self-pay | Admitting: Urgent Care

## 2021-01-25 DIAGNOSIS — I739 Peripheral vascular disease, unspecified: Secondary | ICD-10-CM

## 2021-01-25 DIAGNOSIS — K6289 Other specified diseases of anus and rectum: Secondary | ICD-10-CM

## 2021-02-08 ENCOUNTER — Ambulatory Visit (HOSPITAL_COMMUNITY): Admission: RE | Admit: 2021-02-08 | Payer: Medicare PPO | Source: Ambulatory Visit

## 2021-02-08 ENCOUNTER — Ambulatory Visit (HOSPITAL_COMMUNITY): Payer: Medicare PPO

## 2021-02-17 ENCOUNTER — Encounter (HOSPITAL_COMMUNITY): Payer: Self-pay

## 2021-02-17 ENCOUNTER — Ambulatory Visit (HOSPITAL_COMMUNITY): Payer: Medicare PPO

## 2021-02-23 ENCOUNTER — Encounter: Payer: Self-pay | Admitting: Internal Medicine

## 2021-02-23 ENCOUNTER — Ambulatory Visit: Payer: Medicare PPO | Admitting: Internal Medicine

## 2021-02-23 ENCOUNTER — Other Ambulatory Visit: Payer: Self-pay

## 2021-02-23 VITALS — BP 122/88 | HR 83 | Temp 98.4°F | Ht 66.0 in | Wt 154.2 lb

## 2021-02-23 DIAGNOSIS — E871 Hypo-osmolality and hyponatremia: Secondary | ICD-10-CM | POA: Diagnosis not present

## 2021-02-23 DIAGNOSIS — J432 Centrilobular emphysema: Secondary | ICD-10-CM

## 2021-02-23 DIAGNOSIS — E222 Syndrome of inappropriate secretion of antidiuretic hormone: Secondary | ICD-10-CM | POA: Diagnosis not present

## 2021-02-23 DIAGNOSIS — F17210 Nicotine dependence, cigarettes, uncomplicated: Secondary | ICD-10-CM | POA: Diagnosis not present

## 2021-02-23 DIAGNOSIS — F17200 Nicotine dependence, unspecified, uncomplicated: Secondary | ICD-10-CM

## 2021-02-23 NOTE — Patient Instructions (Signed)
Please schedule follow up scheduled with myself in 6 months.  If my schedule is not open yet, we will contact you with a reminder closer to that time.  Before your next visit I would like you to have: Full set of PFTs - next available, 1 hr

## 2021-02-23 NOTE — Progress Notes (Addendum)
Jacob Barr    [id number]    1951/04/15  Primary Care Physician:Crain, Jodelle Gross, PA  Referring Physician: Maretta Bees, [place] [address], [address] [place],  [place] [address] Reason for Consultation: emphysema Date of Consultation: 02/23/2021  Chief complaint:   Chief Complaint  Patient presents with   Consult    Pulmonary Fibrosis     HPI: Jacob Barr is a 70 y.o. man with chronic hyponatremia and everyday tobacco use disorder who presents for new patient evaluation. Has some dyspnea with exertion but is independent with ADLS. Has an inhaler at home but almost never uses it.  Longtime diagnosis of COPD. Had one COPD exacerbation earlier this year, but that's the first time he's ever had this. Treated at urgent care with steroids, abx. No daily cough or sputum production. No chest tightness or wheezing. No childhood respiratory disease.    He used to smoke 2 ppd but is now down to 1/2 ppd. His wife also smokes at home but she quit already. He is still precontemplative.   Has had 5 ED visits in the last year. For multiple complaints including pain and dizziness. Denies this issues today, most of his concerns about neuropathy and lower extremity pain.  He was referred by primary care for concern for pulmonary fibrosis, and question if lung disease could be etiology of his hyponatremia.    Social history:  Occupation: worked as a Primary school teacher in the past Exposures: lives at home with wife, no pets Smoking history: 2 ppd x 50 years - 100 pack year smoking history.   Social History   Occupational History   Not on file  Tobacco Use   Smoking status: Every Day    Packs/day: 1.50    Years: 50.00    Pack years: 75.00    Types: Cigarettes   Smokeless tobacco: Never   Tobacco comments:    Called 800 quit line and is down to 8 or 9 cigarettes per day  Vaping Use   Vaping Use: Never used  Substance and Sexual Activity   Alcohol use: No    Drug use: No   Sexual activity: Not on file    Relevant family history:  Family History  Problem Relation Age of Onset   Chronic Renal Failure Father     Past Medical History:  Diagnosis Date   COPD (chronic obstructive pulmonary disease) (HCC)    Depression    Emphysema    Tobacco abuse     Past Surgical History:  Procedure Laterality Date   HEMORRHOID SURGERY     HEMORRHOID SURGERY  2008     Physical Exam: Blood pressure 122/88, pulse 83, temperature 98.4 F (36.9 C), temperature source Oral, height 5\' 6"  (1.676 m), weight 154 lb 3.2 oz (69.9 kg), SpO2 99 %. Gen:      No acute distress ENT:  no nasal polyps, mucus membranes moist Lungs:    No increased respiratory effort, symmetric chest wall excursion, diminished bilaterally, no wheezes or crackles CV:         Regular rate and rhythm; no murmurs, rubs, or gallops.  No pedal edema Abd:      + bowel sounds; soft, non-tender; no distension MSK: no acute synovitis of DIP or PIP joints, no mechanics hands.  Skin:      Warm and dry; no rashes Neuro: normal speech, no focal facial asymmetry Psych: alert and oriented x3, normal mood and  affect   Data Reviewed/Medical Decision Making:  Independent interpretation of tests: Imaging:  Review of patient's Ct scan  images revealed centrilobular emphysema, no evidence of pulmonary fibrosis or scarring. The patient's images have been independently reviewed by me.    PFTs:  No flowsheet data found.  Labs:   Na has been low for ten years. Most recently 126 Urine Na 37 Urine Cr 20.5  Cr 1.51  Immunization status:   There is no immunization history on file for this patient.   I reviewed prior external note(s) from cardiology hospital stay  I reviewed the result(s) of the labs and imaging as noted above.   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  For his COPD will obtain baseline PFTs. Continue prn albuterol for now . Consider initiation of maintenance inhaler if dyspnea worsens.   I do not think his emphysema is related to SIADH. Can consider nephrology consultation if he becomes symptomatic or worsens. It seems like this is chronic and stable. He is not on any medications I can tell that commonly cause SIADH.   Declined flu shot  We discussed disease management and progression at length today.   Smoking Cessation Counseling:  1. The patient is an everyday smoker and symptomatic due to the following condition COPD 2. The patient is currently pre-contemplative in quitting smoking. 3. I advised patient to quit smoking. 4. We identified patient specific barriers to change.  5. I personally spent 3  minutes counseling the patient regarding tobacco use disorder. 6. We discussed management of stress and anxiety to help with smoking cessation, when applicable. 7. We discussed nicotine replacement therapy, Wellbutrin, Chantix as possible options. 8. I advised setting a quit date. 9. Follow?up arranged with our office to continue ongoing discussions. 10.Resources given to patient including quit hotline.    Return to Care: Return in about 6 months (around 08/24/2021).  Durel Salts, MD Pulmonary and Critical Care Medicine [facility] Office:[phone]  CC: [place], [place], [place]

## 2021-03-03 ENCOUNTER — Other Ambulatory Visit: Payer: Self-pay | Admitting: Urgent Care

## 2021-03-03 DIAGNOSIS — K6289 Other specified diseases of anus and rectum: Secondary | ICD-10-CM

## 2021-03-23 ENCOUNTER — Ambulatory Visit
Admission: RE | Admit: 2021-03-23 | Discharge: 2021-03-23 | Disposition: A | Discharge status: Home / Self Care | Payer: Medicare PPO | Source: Ambulatory Visit | Attending: Urgent Care | Admitting: Urgent Care

## 2021-03-23 DIAGNOSIS — K6289 Other specified diseases of anus and rectum: Secondary | ICD-10-CM

## 2021-03-23 MED ORDER — IOPAMIDOL (ISOVUE-370) INJECTION 76%
75.0000 mL | Freq: Once | INTRAVENOUS | Status: AC | PRN
  Administered 2021-03-23: 75 mL via INTRAVENOUS

## 2021-04-01 ENCOUNTER — Ambulatory Visit (INDEPENDENT_AMBULATORY_CARE_PROVIDER_SITE_OTHER): Payer: Medicare PPO | Admitting: Internal Medicine

## 2021-04-01 ENCOUNTER — Other Ambulatory Visit: Payer: Self-pay

## 2021-04-01 DIAGNOSIS — J432 Centrilobular emphysema: Secondary | ICD-10-CM | POA: Diagnosis not present

## 2021-04-01 LAB — PULMONARY FUNCTION TEST
DL/VA % pred: 48 %
DL/VA: 2 ml/min/mmHg/L
DLCO cor % pred: 54 %
DLCO cor: 13.33 ml/min/mmHg
DLCO unc % pred: 54 %
DLCO unc: 13.33 ml/min/mmHg
FEF 25-75 Post: 1.95 L/sec
FEF 25-75 Pre: 1.74 L/sec
FEF2575-%Change-Post: 12 %
FEF2575-%Pred-Post: 84 %
FEF2575-%Pred-Pre: 75 %
FEV1-%Change-Post: 3 %
FEV1-%Pred-Post: 110 %
FEV1-%Pred-Pre: 107 %
FEV1-Post: 3.33 L
FEV1-Pre: 3.22 L
FEV1FVC-%Change-Post: 4 %
FEV1FVC-%Pred-Pre: 90 %
FEV6-%Change-Post: 0 %
FEV6-%Pred-Post: 122 %
FEV6-%Pred-Pre: 122 %
FEV6-Post: 4.74 L
FEV6-Pre: 4.73 L
FEV6FVC-%Change-Post: 1 %
FEV6FVC-%Pred-Post: 105 %
FEV6FVC-%Pred-Pre: 103 %
FVC-%Change-Post: -1 %
FVC-%Pred-Post: 116 %
FVC-%Pred-Pre: 118 %
FVC-Post: 4.77 L
FVC-Pre: 4.83 L
Post FEV1/FVC ratio: 70 %
Post FEV6/FVC ratio: 99 %
Pre FEV1/FVC ratio: 67 %
Pre FEV6/FVC Ratio: 98 %
RV % pred: 126 %
RV: 2.95 L
TLC % pred: 108 %
TLC: 7.2 L

## 2021-04-01 NOTE — Progress Notes (Signed)
PFT done today. 

## 2021-04-07 ENCOUNTER — Other Ambulatory Visit: Payer: Self-pay | Admitting: Urgent Care

## 2021-04-07 DIAGNOSIS — I714 Abdominal aortic aneurysm, without rupture, unspecified: Secondary | ICD-10-CM

## 2021-04-20 ENCOUNTER — Encounter: Payer: Self-pay | Admitting: *Deleted

## 2021-04-20 ENCOUNTER — Ambulatory Visit
Admission: RE | Admit: 2021-04-20 | Discharge: 2021-04-20 | Disposition: A | Discharge status: Home / Self Care | Payer: Medicare PPO | Source: Ambulatory Visit | Attending: Urgent Care | Admitting: Urgent Care

## 2021-04-20 DIAGNOSIS — I714 Abdominal aortic aneurysm, without rupture, unspecified: Secondary | ICD-10-CM

## 2021-04-20 HISTORY — PX: IR RADIOLOGIST EVAL & MGMT: IMG5224

## 2021-04-20 NOTE — Consult Note (Signed)
Chief Complaint: Patient was seen in consultation today for abdominal pain at the request of Crain,Whitney L  Referring Physician(s): Crain,Whitney L  History of Present Illness: Jacob Barr is a 70 y.o. male with a litany of symptoms including bilateral upper and lower extremity numbness, chronic anterior abdominal wall pain, chronic rectal pain, and nonspecific bilateral lower extremity pain.  In addition, he has a small fusiform infrarenal abdominal aortic aneurysm that meets imaging criteria for surveillance by ultrasound every 3 years.  He explains to me that he has seen a neurologist who has evaluated his back and his neuropathy.  He was on gabapentin for a while but was unable to tolerate it due to diarrhea.  Similarly, he has seen a gastroenterologist and had a colonoscopy performed which demonstrated no abnormalities.  He was also seen and evaluated by urology who identified a normal prostate gland and PSA as well as abnormal appearance of his bladder on cystoscopy.  Jacob Barr is very frustrated that he has all of the symptoms which have been bothering him for at least a year and has seen multiple physicians across different specialties and no one can seem to identify the source of his symptoms.  He states that all of this began approximately 1 year ago when he had a fainting spell.  After that, he has had these chronic issues with abdominal pain which seems to localize to the anterior abdominal wall and run up and down along his rectus abdominal muscles.  This hurts when he bends or stretches.  Additionally, he has rectal pain when he sits down.  He does have a history of internal hemorrhoids and underwent surgical resection of those in the past.  Further, he has lower back pain that radiates into the legs as well as chronic numbness of his hands and both feet and lower legs.  Finally, he has lower urinary tract symptoms including frequent urination at night.  He feels that all  of the symptoms are somehow related.  He denies abnormal weight loss, nausea, vomiting, fever or chills.  Past Medical History:  Diagnosis Date   COPD (chronic obstructive pulmonary disease) ([place])    Depression    Emphysema    Tobacco abuse     Past Surgical History:  Procedure Laterality Date   HEMORRHOID SURGERY     HEMORRHOID SURGERY  2008    Allergies: Patient has no known allergies.  Medications: Prior to Admission medications   Medication Sig Start Date End Date Taking? Authorizing Provider  albuterol (VENTOLIN HFA) 108 (90 Base) MCG/ACT inhaler Inhale 1-2 puffs into the lungs every 6 (six) hours as needed for wheezing or shortness of breath. Patient not taking: Reported on 02/23/2021 06/13/20   Janith Lima, PA-C  aspirin EC 81 MG tablet Take 1 tablet (81 mg total) by mouth daily. Swallow whole. Patient not taking: Reported on 02/23/2021 06/29/20   Freada Bergeron, MD  fluticasone [facility]) 50 MCG/ACT nasal spray Place 1-2 sprays into both nostrils daily. Patient not taking: Reported on 02/23/2021 06/13/20   Wieters, Madelynn Done C, PA-C  gabapentin (NEURONTIN) 300 MG capsule Take 1 capsule (300 mg total) by mouth 2 (two) times daily. Patient not taking: Reported on 02/23/2021 12/15/20   Jaynee Eagles, PA-C  hyoscyamine (LEVBID) 0.375 MG 12 hr tablet Take 0.375 mg by mouth daily. Stomach spasms Patient not taking: Reported on 02/23/2021    [provider]  pantoprazole (PROTONIX) 20 MG tablet Take 1 tablet (20 mg total) by  mouth daily. Patient not taking: Reported on 02/23/2021 06/20/20   Isla Pence, MD  rosuvastatin (CRESTOR) 10 MG tablet Take 1 tablet (10 mg total) by mouth daily. 06/29/20 09/27/20  Freada Bergeron, MD  sulfamethoxazole-trimethoprim (BACTRIM DS) 800-160 MG tablet Take 1 tablet by mouth 2 (two) times daily. Started 1 week ago,stay on it for 30 days Patient not taking: Reported on 02/23/2021    [provider]     Family History   Problem Relation Age of Onset   Chronic Renal Failure Father     Social History   Socioeconomic History   Marital status: Married    Spouse name: Not on file   Number of children: Not on file   Years of education: Not on file   Highest education level: Not on file  Occupational History   Not on file  Tobacco Use   Smoking status: Every Day    Packs/day: 1.50    Years: 50.00    Pack years: 75.00    Types: Cigarettes   Smokeless tobacco: Never   Tobacco comments:    Called 800 quit line and is down to 8 or 9 cigarettes per day  Vaping Use   Vaping Use: Never used  Substance and Sexual Activity   Alcohol use: No   Drug use: No   Sexual activity: Not on file  Other Topics Concern   Not on file  Social History Narrative   Married, Lives near [place].  Sees Dr. Welton Flakes at [place] Determinants of Health   Financial Resource Strain: Not on file  Food Insecurity: Not on file  Transportation Needs: Not on file  Physical Activity: Not on file  Stress: Not on file  Social Connections: Not on file     Review of Systems: A 12 point ROS discussed and pertinent positives are indicated in the HPI above.  All other systems are negative.  Review of Systems  Vital Signs: BP (!) 147/86 (BP Location: Right Arm)    Pulse 89    SpO2 98%   Physical Exam Constitutional:      General: He is not in acute distress.    Appearance: Normal appearance.  HENT:     Head: Normocephalic and atraumatic.  Eyes:     General: No scleral icterus. Cardiovascular:     Rate and Rhythm: Normal rate.  Pulmonary:     Effort: Pulmonary effort is normal.  Abdominal:     General: Abdomen is flat.     Palpations: Abdomen is soft.  Skin:    General: Skin is warm and dry.  Neurological:     Mental Status: He is alert and oriented to person, place, and time.  Psychiatric:        Mood and Affect: Mood normal.        Behavior: Behavior normal.       Imaging: CT Angio Abd/Pel w/ and/or w/o  Result Date: 03/23/2021 CLINICAL DATA:  Peripheral vascular disease. Pelvic and rectal pain for the past 6-8 months. Evaluate for Leriche syndrome. EXAM: CTA ABDOMEN AND PELVIS WITHOUT AND WITH CONTRAST TECHNIQUE: Multidetector CT imaging of the abdomen and pelvis was performed using the standard protocol during bolus administration of intravenous contrast. Multiplanar reconstructed images and MIPs were obtained and reviewed to evaluate the vascular anatomy. CONTRAST:  38m ISOVUE-370 IOPAMIDOL (ISOVUE-370) INJECTION 76% COMPARISON:  CT abdomen and pelvis-01/01/2021; 07/23/2020; 06/20/2020; 09/24/2011 Chest CT-07/22/2020 FINDINGS: VASCULAR Aorta: There is a moderate to large amount  of slightly irregular predominantly noncalcified mural thrombus within the dominant component of the infrarenal abdominal aortic aneurysm. Aneurysm measures 3.1 x 3.2 cm in greatest oblique short axis axial (image 105, series 5) and sagittal (image 104, series 8), dimensions respectively, similar to the 06/20/2020 examination though increased in size compared to remote abdominal CT performed 09/2011, at which time the abdominal aorta was of normal caliber. The aneurysm originates 4.9 cm caudal to the take-off of the most inferior right renal artery (coronal image 64, series 7 and extends to the level of the aortic bifurcation. No abdominal aortic dissection or perivascular stranding. Celiac: Widely patent without hemodynamically significant narrowing. Conventional branching pattern. SMA: There is a minimal amount of mixed calcified and noncalcified atherosclerotic plaque involving the origin of the SMA, not resulting in a hemodynamically significant stenosis. Conventional branching pattern. The distal tributaries of the SMA appear widely patent without discrete semi filling defect to suggest distal embolism. Renals: Solitary bilaterally; there is a minimal amount of calcified and  noncalcified atherosclerotic plaque involving the origin of the bilateral renal arteries, not resulting in a hemodynamically significant stenosis. No vessel irregularity to suggest FMD. IMA: Diseased at its origin though remains patent with hypertrophied collateralized supply from the SMA. Inflow: There is a moderate to large amount of eccentric slightly irregular predominantly calcified atherosclerotic plaque involving the bilateral common iliac arteries, not resulting in a hemodynamically significant stenosis. The bilateral internal iliac arteries are diseased though patent and of normal caliber. The bilateral external iliac arteries are of normal caliber and widely patent without a hemodynamically significant narrowing. Proximal Outflow: The bilateral common and imaged portions of the bilateral deep and superficial femoral arteries are of normal caliber and widely patent without hemodynamically significant stenosis. Veins: The IVC and pelvic venous systems appear widely patent. Incidental note is made of a retroaortic left renal vein. Review of the MIP images confirms the above findings. _________________________________________________________ NON-VASCULAR Lower chest: Limited visualization of the lower thorax demonstrates an approximately 0.6 cm subpleural nodule within the imaged caudal aspect of the right middle lobe (image 4, series 11) as well as an approximately 0.4 cm subpleural nodule within the right lower lobe (image 1, series 11), both of which are similar to dedicated chest CT performed 07/2020 (not imaged on previous abdominal CTs). Advanced centrilobular emphysematous change within the imaged lung bases. Normal heart size.  No pericardial effusion. Hepatobiliary: Normal hepatic contour. No discrete hepatic lesions. Normal appearance of the gallbladder given degree of distention. No radiopaque gallstones. No intra or extrahepatic biliary dilatation. No ascites. Pancreas: Normal appearance of the  pancreas. Spleen: Normal appearance of the spleen. Adrenals/Urinary Tract: There is symmetric enhancement of the bilateral kidneys. No evidence of nephrolithiasis on this postcontrast examination. Note is made of an approximately 1.5 cm hypoattenuating left-sided renal cyst (coronal image 94, series 7). Partially exophytic hypoattenuating lesion arising from the superior pole of the right kidney (image 93, series 7 is too small to accurately characterize though similar to the 06/2020 examination. Additional bilateral subcentimeter hypoattenuating renal lesions are too small to accurately characterize though favored to represent additional renal cysts. No urine obstruction or perinephric stranding. There is mild thickening of the bilateral adrenal glands without discrete nodule. Mild diffuse thickening of the urinary bladder wall, potentially accentuated due to underdistention. Stomach/Bowel: Postoperative change of the rectum/distal sigmoid colon without evidence of enteric obstruction. Large colonic stool burden. Normal appearance of the terminal ileum. The appendix is not visualized, however there is no pericecal inflammatory change. No  discrete areas of bowel wall thickening. No significant hiatal hernia. No pneumoperitoneum, pneumatosis or portal venous gas. Lymphatic: No bulky retroperitoneal, mesenteric, pelvic or inguinal lymphadenopathy. Reproductive: Normal appearance of the prostate gland. No free fluid within the pelvic cul-de-sac. Other: Regional soft tissues appear normal. Musculoskeletal: No acute or aggressive osseous abnormalities. Moderate DDD of L5-S1 with disc space height loss, endplate irregularity and sclerosis. Mild degenerative change of the bilateral hips with joint space loss, subchondral sclerosis and osteophytosis. IMPRESSION: VASCULAR 1. Aneurysmal dilatation of the infrarenal abdominal aorta measuring 3.2 cm in diameter, similar to the 06/2020 examination though new compared to remote  abdominal CT performed in 2013. Abdominal Aortic Aneurysm (ICD10-I71.9). Recommend follow-up aortic ultrasound in 3 years. This recommendation follows ACR consensus guidelines: White Paper of the ACR Incidental Findings Committee II on Vascular Findings. J Am Coll Radiol 2013; 10:789-794. 2. There is a moderate to large amount of slightly irregular predominantly noncalcified mural thrombus within the dominant component of the aneurysm, not resulting in a hemodynamically significant stenosis. No abdominal aortic dissection or perivascular stranding. 3.  Aortic Atherosclerosis (ICD10-I70.0). NON-VASCULAR 1. Stable postoperative change of the rectum/distal sigmoid colon. 2. Large colonic stool burden without evidence of enteric obstruction. 3. Stable punctate (sub 6 mm) pulmonary nodules within the imaged right middle and lower lobes, similar to dedicated chest CT performed 07/2020 (not imaged on previous abdominal CTs) at which time continued surveillance with annual low-dose chest CT without contrast was recommended. 4.  Emphysema (ICD10-J43.9). Electronically Signed   By: Sandi Mariscal M.D.   On: 03/23/2021 11:18    Labs:  CBC: Recent Labs    06/20/20 0845 08/10/20 2125 01/01/21 1441  WBC 10.6* 9.7 6.3  HGB 14.4 13.2 13.1  HCT 41.7 36.0* 37.5*  PLT 299 232 247    COAGS: Recent Labs    06/20/20 0845  INR 1.1    BMP: Recent Labs    06/20/20 0845 08/10/20 2125 01/01/21 1441  NA 128* 123* 126*  K 4.0 3.4* 4.2  CL 97* 91* 95*  CO2 22 25 22   GLUCOSE 114* 121* 100*  BUN 20 13 15   CALCIUM 9.0 8.6* 9.1  CREATININE 1.28* 1.00 1.51*  GFRNONAA >60 >60 49*    LIVER FUNCTION TESTS: Recent Labs    06/20/20 0845 08/10/20 2125 01/01/21 1441  BILITOT 0.8 0.4 0.5  AST 21 20 19   ALT 23 21 13   ALKPHOS 70 88 82  PROT 6.2* 6.6 6.9  ALBUMIN 3.9 4.1 4.0    TUMOR MARKERS: No results for input(s): AFPTM, CEA, CA199, CHROMGRNA in the last 8760 hours.  Assessment and Plan:  Very pleasant  but frustrated 70 year old gentleman with a litany of symptoms including bilateral upper and lower extremity numbness, chronic anterior abdominal wall pain, chronic rectal pain, and nonspecific bilateral lower extremity pain.  In addition, he has a small fusiform infrarenal abdominal aortic aneurysm that meets imaging criteria for surveillance by ultrasound every 3 years.  I reviewed his imaging very carefully and I do not see any evidence of ventral abdominal wall hernia, active colitis, enteritis or other clinical entities.  He is already undergone a fairly extensive work-up including visiting with a neurologist, nephrologist and gastroenterologist all of whom were unable to identify any treatable concerns.  The only additional insight I can offer, is perhaps he has an underlying inflammatory or autoimmune condition.  I recommended to him that he follow-up with his PCP to be screened for an underlying inflammatory condition (ESR, CRP, possibly antinuclear  antibodies, etc).  If there was evidence of inflammation, or an autoimmune abnormality then referral to a rheumatologist may be of benefit.  Otherwise, recommend follow-up abdominal ultrasound in 3 years to assess the aortic aneurysm.  If there is evidence of enlargement, referral to vascular surgery may become warranted at that time.  Thank you for this interesting consult.  I greatly enjoyed meeting Jacob Barr and look forward to participating in their care.  A copy of this report was sent to the requesting provider on this date.  Electronically Signed: Criselda Peaches 04/20/2021, 11:35 AM   I spent a total of  40 Minutes in face to face in clinical consultation, greater than 50% of which was counseling/coordinating care for AAA.

## 2021-06-10 DIAGNOSIS — M2550 Pain in unspecified joint: Secondary | ICD-10-CM | POA: Insufficient documentation

## 2021-06-10 DIAGNOSIS — M542 Cervicalgia: Secondary | ICD-10-CM | POA: Insufficient documentation

## 2021-10-13 ENCOUNTER — Other Ambulatory Visit: Payer: Self-pay | Admitting: *Deleted

## 2021-10-13 ENCOUNTER — Encounter: Payer: Self-pay | Admitting: *Deleted

## 2021-10-17 ENCOUNTER — Ambulatory Visit (INDEPENDENT_AMBULATORY_CARE_PROVIDER_SITE_OTHER): Payer: Medicare PPO | Admitting: Diagnostic Neuroimaging

## 2021-10-17 ENCOUNTER — Encounter: Payer: Self-pay | Admitting: Diagnostic Neuroimaging

## 2021-10-17 VITALS — BP 149/90 | HR 65 | Ht 66.0 in | Wt 163.0 lb

## 2021-10-17 DIAGNOSIS — G629 Polyneuropathy, unspecified: Secondary | ICD-10-CM

## 2021-10-17 MED ORDER — DULOXETINE HCL 30 MG PO CPEP: 30.0000 mg | ORAL_CAPSULE | Freq: Every day | ORAL | 6 refills | Status: AC

## 2021-10-17 NOTE — Progress Notes (Signed)
[facility]  PATIENT: Jacob Barr DOB: [birth year]  REFERRING CLINICIAN: Jesus Genera, PA HISTORY FROM: patient  REASON FOR VISIT: new consult   HISTORICAL  CHIEF COMPLAINT:  Chief Complaint  Patient presents with   New Patient (Initial Visit)    RM 6 with wife here for consult on worsening neuropathy. Pt reports sx are bilateral in legs and feet. Sx have been ongoing for a few years now and sx are equal on both sides. Has tried gabapentin in the past but did not help with sx.     HISTORY OF PRESENT ILLNESS:   71 year old male here for evaluation of neuropathy.  Having numbness, tingling, pins-and-needles in toes and feet spreading up to the knees and now in the fingers and hands.  Symptoms been present for about 2-3 years and progressively worsening over time.  Has tried gabapentin in the past without help.   REVIEW OF SYSTEMS: Full 14 system review of systems performed and negative with exception of: as per HPI.  ALLERGIES: No Known Allergies  HOME MEDICATIONS: Outpatient Medications Prior to Visit  Medication Sig Dispense Refill   albuterol (VENTOLIN HFA) 108 (90 Base) MCG/ACT inhaler Inhale 1-2 puffs into the lungs every 6 (six) hours as needed for wheezing or shortness of breath. (Patient not taking: Reported on 02/23/2021) 18 g 0   aspirin EC 81 MG tablet Take 1 tablet (81 mg total) by mouth daily. Swallow whole. (Patient not taking: Reported on 02/23/2021) 90 tablet 3   cilostazol (PLETAL) 100 MG tablet Take 1 tablet twice a day by oral route for 30 days.     fluticasone (FLONASE) 50 MCG/ACT nasal spray Place 1-2 sprays into both nostrils daily. (Patient not taking: Reported on 02/23/2021) 16 g 0   gabapentin (NEURONTIN) 300 MG capsule Take 1 capsule (300 mg total) by mouth 2 (two) times daily. (Patient not taking: Reported on 02/23/2021) 30 capsule 0   hyoscyamine (LEVBID) 0.375 MG 12 hr tablet Take 0.375 mg by mouth daily. Stomach spasms  (Patient not taking: Reported on 02/23/2021)     pantoprazole (PROTONIX) 20 MG tablet Take 1 tablet (20 mg total) by mouth daily. (Patient not taking: Reported on 02/23/2021) 30 tablet 0   polyethylene glycol (MIRALAX) 17 g packet 1 packet mixed with 8 ounces of fluid     rosuvastatin (CRESTOR) 10 MG tablet Take 1 tablet (10 mg total) by mouth daily. 90 tablet 3   sulfamethoxazole-trimethoprim (BACTRIM DS) 800-160 MG tablet Take 1 tablet by mouth 2 (two) times daily. Started 1 week ago,stay on it for 30 days (Patient not taking: Reported on 02/23/2021)     No facility-administered medications prior to visit.    PAST MEDICAL HISTORY: Past Medical History:  Diagnosis Date   Abdominal aortic aneurysm (AAA) (HCC)    Chronic hyponatremia    COPD (chronic obstructive pulmonary disease) (HCC)    Depression    Emphysema    Peripheral neuropathy    Tobacco abuse     PAST SURGICAL HISTORY: Past Surgical History:  Procedure Laterality Date   HEMORRHOID SURGERY     HEMORRHOID SURGERY  2008   IR RADIOLOGIST EVAL & MGMT  04/20/2021    FAMILY HISTORY: Family History  Problem Relation Age of Onset   Chronic Renal Failure Father     SOCIAL HISTORY: Social History   Socioeconomic History   Marital status: Married    Spouse name: Not on file   Number of children: Not on file  Years of education: Not on file   Highest education level: Not on file  Occupational History   Not on file  Tobacco Use   Smoking status: Every Day    Packs/day: 1.50    Years: 50.00    Total pack years: 75.00    Types: Cigarettes   Smokeless tobacco: Never   Tobacco comments:    Called 800 quit line and is down to 8 or 9 cigarettes per day  Vaping Use   Vaping Use: Never used  Substance and Sexual Activity   Alcohol use: No   Drug use: No   Sexual activity: Not on file  Other Topics Concern   Not on file  Social History Narrative   Married, Lives near [place].  Sees Dr. Welton Flakes at [facility]    Right handed    Caffeine 5 cups of coffee.    Social Determinants of Health   Financial Resource Strain: Not on file  Food Insecurity: Not on file  Transportation Needs: Not on file  Physical Activity: Not on file  Stress: Not on file  Social Connections: Not on file  Intimate Partner Violence: Not on file     PHYSICAL EXAM  GENERAL EXAM/CONSTITUTIONAL: Vitals:  Vitals:   10/17/21 0943  BP: (!) 149/90  Pulse: 65  Weight: 163 lb (73.9 kg)  Height: 5\' 6"  (1.676 m)   Body mass index is 26.31 kg/m. Wt Readings from Last 3 Encounters:  10/17/21 163 lb (73.9 kg)  02/23/21 154 lb 3.2 oz (69.9 kg)  01/01/21 154 lb 5.2 oz (70 kg)   Patient is in no distress; well developed, nourished and groomed; neck is supple  CARDIOVASCULAR: Examination of carotid arteries is normal; no carotid bruits Regular rate and rhythm, no murmurs Examination of peripheral vascular system by observation and palpation is normal  EYES: Ophthalmoscopic exam of optic discs and posterior segments is normal; no papilledema or hemorrhages No results found.  MUSCULOSKELETAL: Gait, strength, tone, movements noted in Neurologic exam below  NEUROLOGIC: MENTAL STATUS:      No data to display         awake, alert, oriented to person, place and time recent and remote memory intact normal attention and concentration language fluent, comprehension intact, naming intact fund of knowledge appropriate  CRANIAL NERVE:  2nd - no papilledema on fundoscopic exam 2nd, 3rd, 4th, 6th - pupils equal and reactive to light, visual fields full to confrontation, extraocular muscles intact, no nystagmus 5th - facial sensation symmetric 7th - facial strength symmetric 8th - hearing intact 9th - palate elevates symmetrically, uvula midline 11th - shoulder shrug symmetric 12th - tongue protrusion midline  MOTOR:  normal bulk and tone, full strength in the BUE, BLE  SENSORY:  normal and  symmetric to light touch, pinprick, temperature, vibration  COORDINATION:  finger-nose-finger, fine finger movements normal  REFLEXES:  deep tendon reflexes TRACE and symmetric  GAIT/STATION:  narrow based gait    DIAGNOSTIC DATA (LABS, IMAGING, TESTING) - I reviewed patient records, labs, notes, testing and imaging myself where available.  Lab Results  Component Value Date   WBC 6.3 01/01/2021   HGB 13.1 01/01/2021   HCT 37.5 (L) 01/01/2021   MCV 90.4 01/01/2021   PLT 247 01/01/2021      Component Value Date/Time   NA 126 (L) 01/01/2021 1441   K 4.2 01/01/2021 1441   CL 95 (L) 01/01/2021 1441   CO2 22 01/01/2021 1441   GLUCOSE 100 (H)  01/01/2021 1441   BUN 15 01/01/2021 1441   CREATININE 1.51 (H) 01/01/2021 1441   CALCIUM 9.1 01/01/2021 1441   PROT 6.9 01/01/2021 1441   ALBUMIN 4.0 01/01/2021 1441   AST 19 01/01/2021 1441   ALT 13 01/01/2021 1441   ALKPHOS 82 01/01/2021 1441   BILITOT 0.5 01/01/2021 1441   GFRNONAA 49 (L) 01/01/2021 1441   GFRAA 60 (L) 06/29/2018 0711   Lab Results  Component Value Date   CHOL 154 07/03/2011   HDL 71 07/03/2011   LDLCALC 69 07/03/2011   TRIG 71 07/03/2011   CHOLHDL 2.2 07/03/2011   Lab Results  Component Value Date   HGBA1C 6.0 (H) 09/23/2011   No results found for: "VITAMINB12" Lab Results  Component Value Date   TSH 0.381 09/23/2011    09/05/20 MRI lumbar  1. At L3-L4, similar left foraminal/extraforaminal disc protrusion which contacts the exiting left L3 nerve with overall mild left foraminal stenosis. 2. At L5-S1, similar mild disc bulge with annular fissure without significant canal or foraminal stenosis.    ASSESSMENT AND PLAN  71 y.o. year old male here with glove and stocking distribution numbness, tingling, pins-and-needles, since 2020.   Dx:  1. Neuropathy      PLAN:  NEUROPATHY PAIN (hands, feet, legs; since ~2020; tried gabapentin 300mg  twice a day without relief) - check neuropathy  labs - duloxetine 30mg  daily (for neuropathy pain)  Orders Placed This Encounter  Procedures   CBC with diff   CMP   Vitamin B12   A1c   TSH   SPEP with IFE   ANA w/Reflex   SSA, SSB   Meds ordered this encounter  Medications   DULoxetine (CYMBALTA) 30 MG capsule    Sig: Take 1 capsule (30 mg total) by mouth daily.    Dispense:  30 capsule    Refill:  6   Return for pending if symptoms worsen or fail to improve, pending test results.    Penni Bombard, MD [id number], [id number] AM Certified in Neurology, Neurophysiology and Neuroimaging  [facility] [address], [place], [place] [address] [phone]

## 2021-10-17 NOTE — Patient Instructions (Signed)
-   neuropathy labs  - duloxetine 30mg  daily (for neuropathy pain)

## 2021-10-21 LAB — ENA+DNA/DS+ANTICH+CENTRO+FA...
Anti JO-1: <0.2 AI (ref 0.0–0.9)
Antiribosomal P Antibodies: <0.2 AI (ref 0.0–0.9)
Centromere Ab Screen: <0.2 AI (ref 0.0–0.9)
Chromatin Ab SerPl-aCnc: <0.2 AI (ref 0.0–0.9)
ENA RNP Ab: 0.5 AI (ref 0.0–0.9)
ENA SM Ab Ser-aCnc: <0.2 AI (ref 0.0–0.9)
Nucleolar Pattern: 1:160 {titer} — ABNORMAL HIGH
Scleroderma (Scl-70) (ENA) Antibody, IgG: <0.2 AI (ref 0.0–0.9)
Smith/RNP Antibodies: <0.2 AI (ref 0.0–0.9)
dsDNA Ab: <1 IU/mL (ref 0–9)

## 2021-10-21 LAB — MULTIPLE MYELOMA PANEL, SERUM
Albumin SerPl Elph-Mcnc: 4 g/dL (ref 2.9–4.4)
Albumin/Glob SerPl: 1.3 (ref 0.7–1.7)
Alpha 1: 0.3 g/dL (ref 0.0–0.4)
Alpha2 Glob SerPl Elph-Mcnc: 0.8 g/dL (ref 0.4–1.0)
B-Globulin SerPl Elph-Mcnc: 0.9 g/dL (ref 0.7–1.3)
Gamma Glob SerPl Elph-Mcnc: 1.3 g/dL (ref 0.4–1.8)
Globulin, Total: 3.2 g/dL (ref 2.2–3.9)
IgA/Immunoglobulin A, Serum: 179 mg/dL (ref 61–437)
IgG (Immunoglobin G), Serum: 1211 mg/dL (ref 603–1613)
IgM (Immunoglobulin M), Srm: 142 mg/dL (ref 20–172)

## 2021-10-21 LAB — CBC WITH DIFFERENTIAL/PLATELET
Basophils Absolute: 0 10*3/uL (ref 0.0–0.2)
Basos: 0 %
EOS (ABSOLUTE): 0.1 10*3/uL (ref 0.0–0.4)
Eos: 1 %
Hematocrit: 43.2 % (ref 37.5–51.0)
Hemoglobin: 14.4 g/dL (ref 13.0–17.7)
Immature Grans (Abs): 0 10*3/uL (ref 0.0–0.1)
Immature Granulocytes: 0 %
Lymphocytes Absolute: 2.2 10*3/uL (ref 0.7–3.1)
Lymphs: 31 %
MCH: 31.2 pg (ref 26.6–33.0)
MCHC: 33.3 g/dL (ref 31.5–35.7)
MCV: 94 fL (ref 79–97)
Monocytes Absolute: 0.6 10*3/uL (ref 0.1–0.9)
Monocytes: 9 %
Neutrophils Absolute: 4.2 10*3/uL (ref 1.4–7.0)
Neutrophils: 59 %
Platelets: 214 10*3/uL (ref 150–450)
RBC: 4.61 x10E6/uL (ref 4.14–5.80)
RDW: 12.7 % (ref 11.6–15.4)
WBC: 7.1 10*3/uL (ref 3.4–10.8)

## 2021-10-21 LAB — ANA W/REFLEX: ANA Titer 1: POSITIVE — AB

## 2021-10-21 LAB — SJOGREN'S SYNDROME ANTIBODS(SSA + SSB)
ENA SSA (RO) Ab: <0.2 AI (ref 0.0–0.9)
ENA SSB (LA) Ab: <0.2 AI (ref 0.0–0.9)

## 2021-10-21 LAB — COMPREHENSIVE METABOLIC PANEL
ALT: 13 IU/L (ref 0–44)
AST: 16 IU/L (ref 0–40)
Albumin/Globulin Ratio: 1.7 (ref 1.2–2.2)
Albumin: 4.5 g/dL (ref 3.8–4.8)
Alkaline Phosphatase: 111 IU/L (ref 44–121)
BUN/Creatinine Ratio: 12 (ref 10–24)
BUN: 17 mg/dL (ref 8–27)
Bilirubin Total: 0.5 mg/dL (ref 0.0–1.2)
CO2: 17 mmol/L — ABNORMAL LOW (ref 20–29)
Calcium: 9.4 mg/dL (ref 8.6–10.2)
Chloride: 96 mmol/L (ref 96–106)
Creatinine, Ser: 1.41 mg/dL — ABNORMAL HIGH (ref 0.76–1.27)
Globulin, Total: 2.7 g/dL (ref 1.5–4.5)
Glucose: 101 mg/dL — ABNORMAL HIGH (ref 70–99)
Potassium: 5.1 mmol/L (ref 3.5–5.2)
Sodium: 131 mmol/L — ABNORMAL LOW (ref 134–144)
Total Protein: 7.2 g/dL (ref 6.0–8.5)
eGFR: 54 mL/min/{1.73_m2} — ABNORMAL LOW (ref >59)

## 2021-10-21 LAB — HEMOGLOBIN A1C
Est. average glucose Bld gHb Est-mCnc: 120 mg/dL
Hgb A1c MFr Bld: 5.8 % — ABNORMAL HIGH (ref 4.8–5.6)

## 2021-10-21 LAB — TSH: TSH: 1.97 u[IU]/mL (ref 0.450–4.500)

## 2021-10-21 LAB — VITAMIN B12: Vitamin B-12: 457 pg/mL (ref 232–1245)

## 2021-11-03 ENCOUNTER — Telehealth: Payer: Self-pay | Admitting: *Deleted

## 2021-11-03 DIAGNOSIS — G629 Polyneuropathy, unspecified: Secondary | ICD-10-CM

## 2021-11-03 DIAGNOSIS — R899 Unspecified abnormal finding in specimens from other organs, systems and tissues: Secondary | ICD-10-CM

## 2021-11-03 NOTE — Telephone Encounter (Signed)
Please call patient, if he tolerates Cymbalta 30 mg daily, may try higher dose 60 mg daily  Positive ANA with titer of 1-1 60, nucleolar pattern, hematology referral was put in

## 2021-11-03 NOTE — Telephone Encounter (Signed)
Called patient, informed him his labs showed Positive ANA; Dr Marjory Lies recommends rheumatology evaluation. Stable low sodium and mild kidney issues are noted. These can be monitored by PCP. Other labs are ok. He would like referral to rheumatology. He stated he has not gotten any relief of neuropathy pain from duloxetine 30 mg daily. I informed him Dr Marjory Lies is out of office; I can send to work in [place]. He asked that I send for possible increase in dose. I advised him I will.  Patient verbalized understanding, appreciation.

## 2021-11-03 NOTE — Telephone Encounter (Signed)
Called patient and informed him Dr Zannie Cove recommendation to double cymbalta, advised he let us know next week if it is effective. If so, a new Rx can be sent in. I informed him she sent referral as well. Patient verbalized understanding, appreciation.

## 2021-11-07 ENCOUNTER — Telehealth: Payer: Self-pay | Admitting: Diagnostic Neuroimaging

## 2021-11-07 NOTE — Telephone Encounter (Signed)
Referral for Rheumatology sent to [facility] [phone]. 

## 2021-11-21 ENCOUNTER — Telehealth: Payer: Self-pay | Admitting: Diagnostic Neuroimaging

## 2021-11-21 NOTE — Telephone Encounter (Signed)
Contacted pt back and said he was taking the Cymbalta once a day and he did double it but that did not make a difference. He states that his feet and hands are numb. He was sent to a rheumatologist. I told him that I will speak to Dr. Marjory Lies to see if he would like to adjust pt medications or if he has other recommendations. Please advise

## 2021-11-21 NOTE — Telephone Encounter (Signed)
As noted,  per telephone note 6/29 Dr. Terrace Arabia already increased medication to 60 mg daily. Do you want to further increase?

## 2021-11-21 NOTE — Telephone Encounter (Signed)
Pt called and stated medicationDULoxetine (CYMBALTA) 30 MG capsule is not working and Pt said he would like to discuss this with a nurse or doctor.

## 2021-11-22 NOTE — Telephone Encounter (Signed)
Called pt and verbalized there was no other recommendation. He stated that he will follow up with Rheumatology and appreciated the call.

## 2022-04-19 DIAGNOSIS — J449 Chronic obstructive pulmonary disease, unspecified: Secondary | ICD-10-CM | POA: Insufficient documentation

## 2022-04-19 DIAGNOSIS — J329 Chronic sinusitis, unspecified: Secondary | ICD-10-CM | POA: Insufficient documentation

## 2022-08-08 ENCOUNTER — Encounter: Payer: Self-pay | Admitting: Podiatry

## 2022-08-08 ENCOUNTER — Ambulatory Visit: Payer: Medicare PPO | Admitting: Podiatry

## 2022-08-08 VITALS — BP 157/96 | HR 72

## 2022-08-08 DIAGNOSIS — G609 Hereditary and idiopathic neuropathy, unspecified: Secondary | ICD-10-CM

## 2022-08-08 NOTE — Progress Notes (Signed)
  Subjective:  Patient ID: Jacob Barr, male    DOB: Sep 19, 1950,  MRN: [id number]  Chief Complaint  Patient presents with   Numbness    np bil foot numbess and painful all the time/ pt thinks possible neuropathy    72 y.o. male presents with the above complaint. History confirmed with patient.  He has been diagnosed with neuropathy before.  A trial of gabapentin and Cymbalta was had but has not had improvement with this.  Does not have pain but just numbness and tightness feeling and feeling of cold around the feet.  Objective:  Physical Exam: warm, good capillary refill, no trophic changes or ulcerative lesions, normal DP and PT pulses, normal monofilament exam, and normal sensory exam.  Responds well to stimulus but says his feet just feel numb  Previous B12 level in June 2023 was normal Assessment:   1. Idiopathic peripheral neuropathy      Plan:  Patient was evaluated and treated and all questions answered.  We discussed multiple etiologies of peripheral neuropathy including the possibility of idiopathic peripheral neuropathy.  His B12 level is normal.  No history of sciatica, previously had a lumbar MRI which was normal.  He has good peripheral circulation.  Likely is idiopathic in nature and I discussed with him this is difficult to treat especially the numbness component.  Gabapentin was not helpful we discussed this typically is helpful for the painful symptoms of neuropathy.  He will follow-up with me as needed.  Return if symptoms worsen or fail to improve.

## 2023-01-22 IMAGING — DX DG CHEST 2V
2 series · 2 of 2 positions shown · non-contrast
Comparison: June 29, 2018.

CLINICAL DATA: Cough.

EXAM:
CHEST - 2 VIEW

[chest pa]
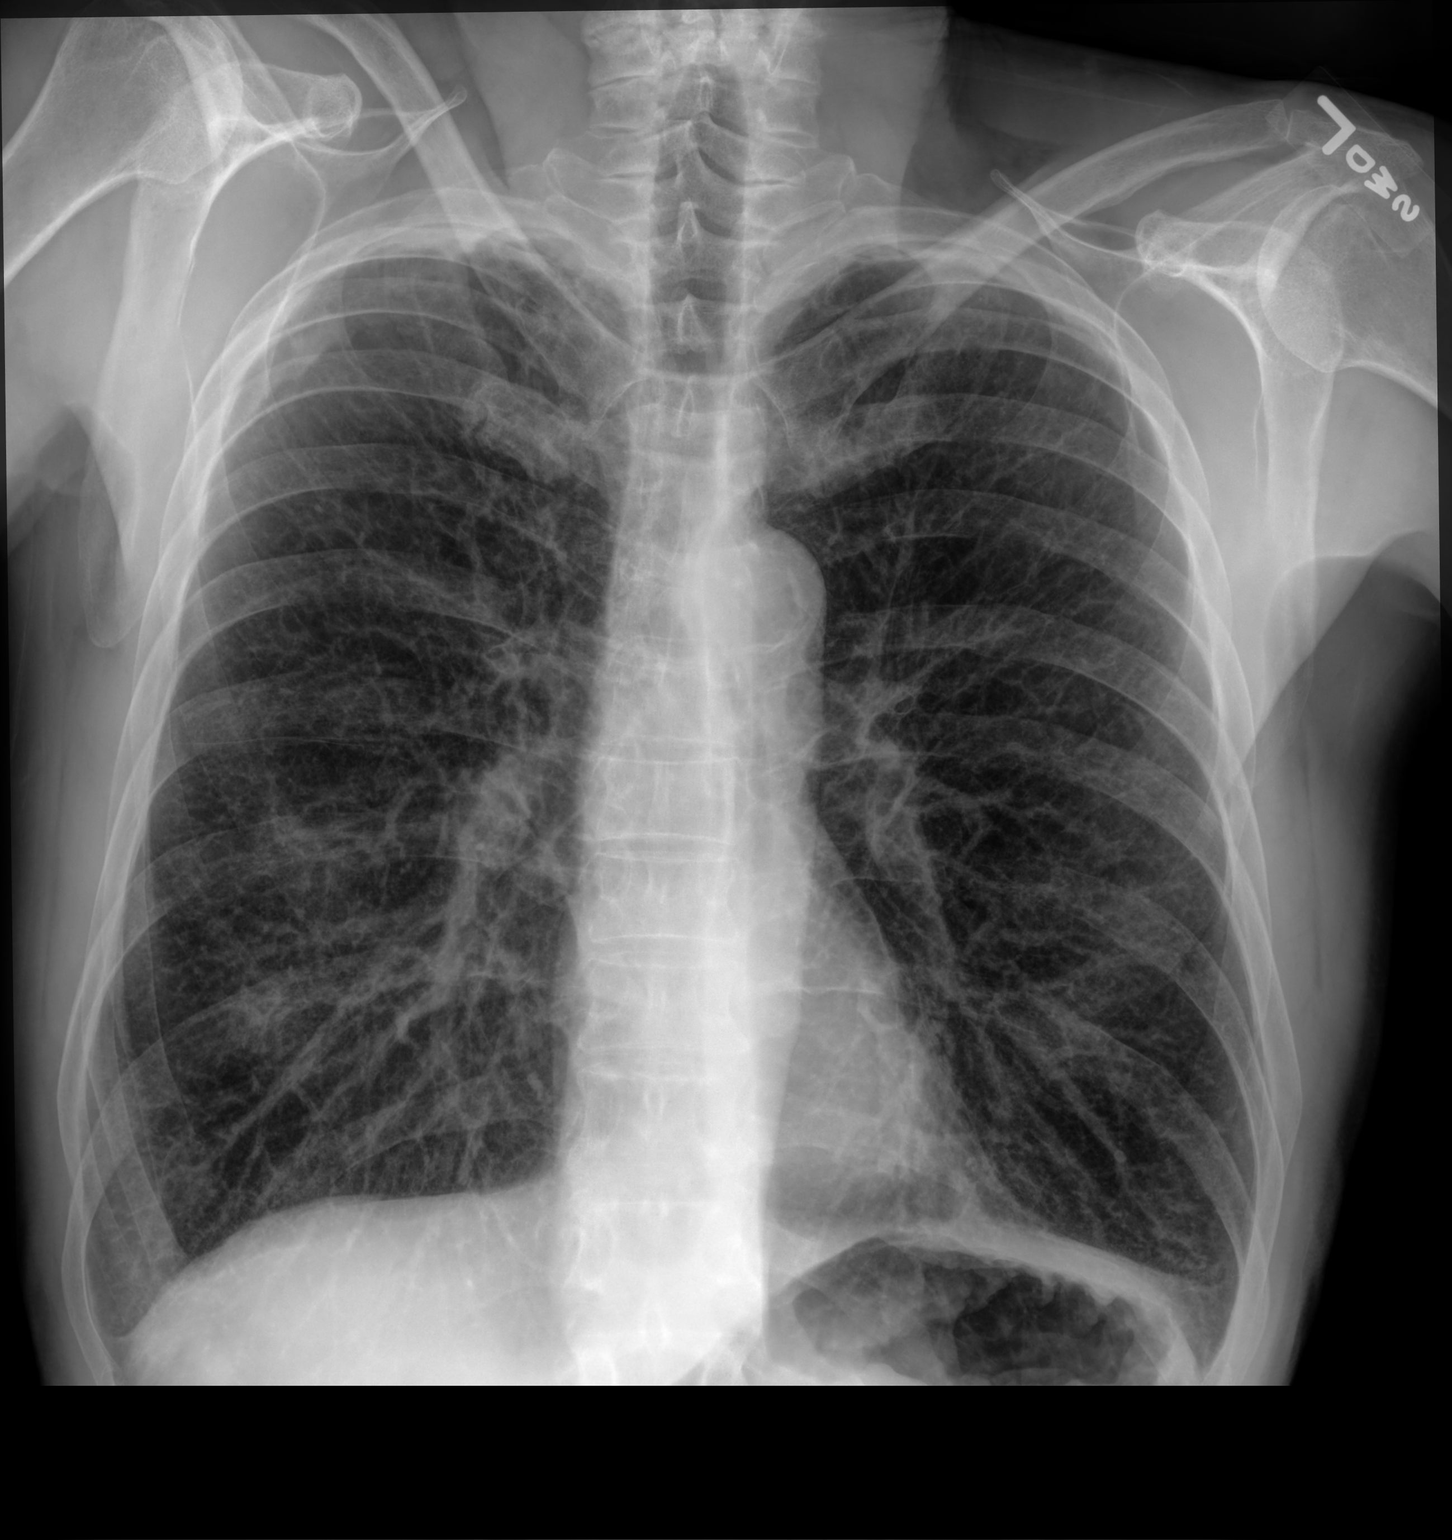

[chest lat]
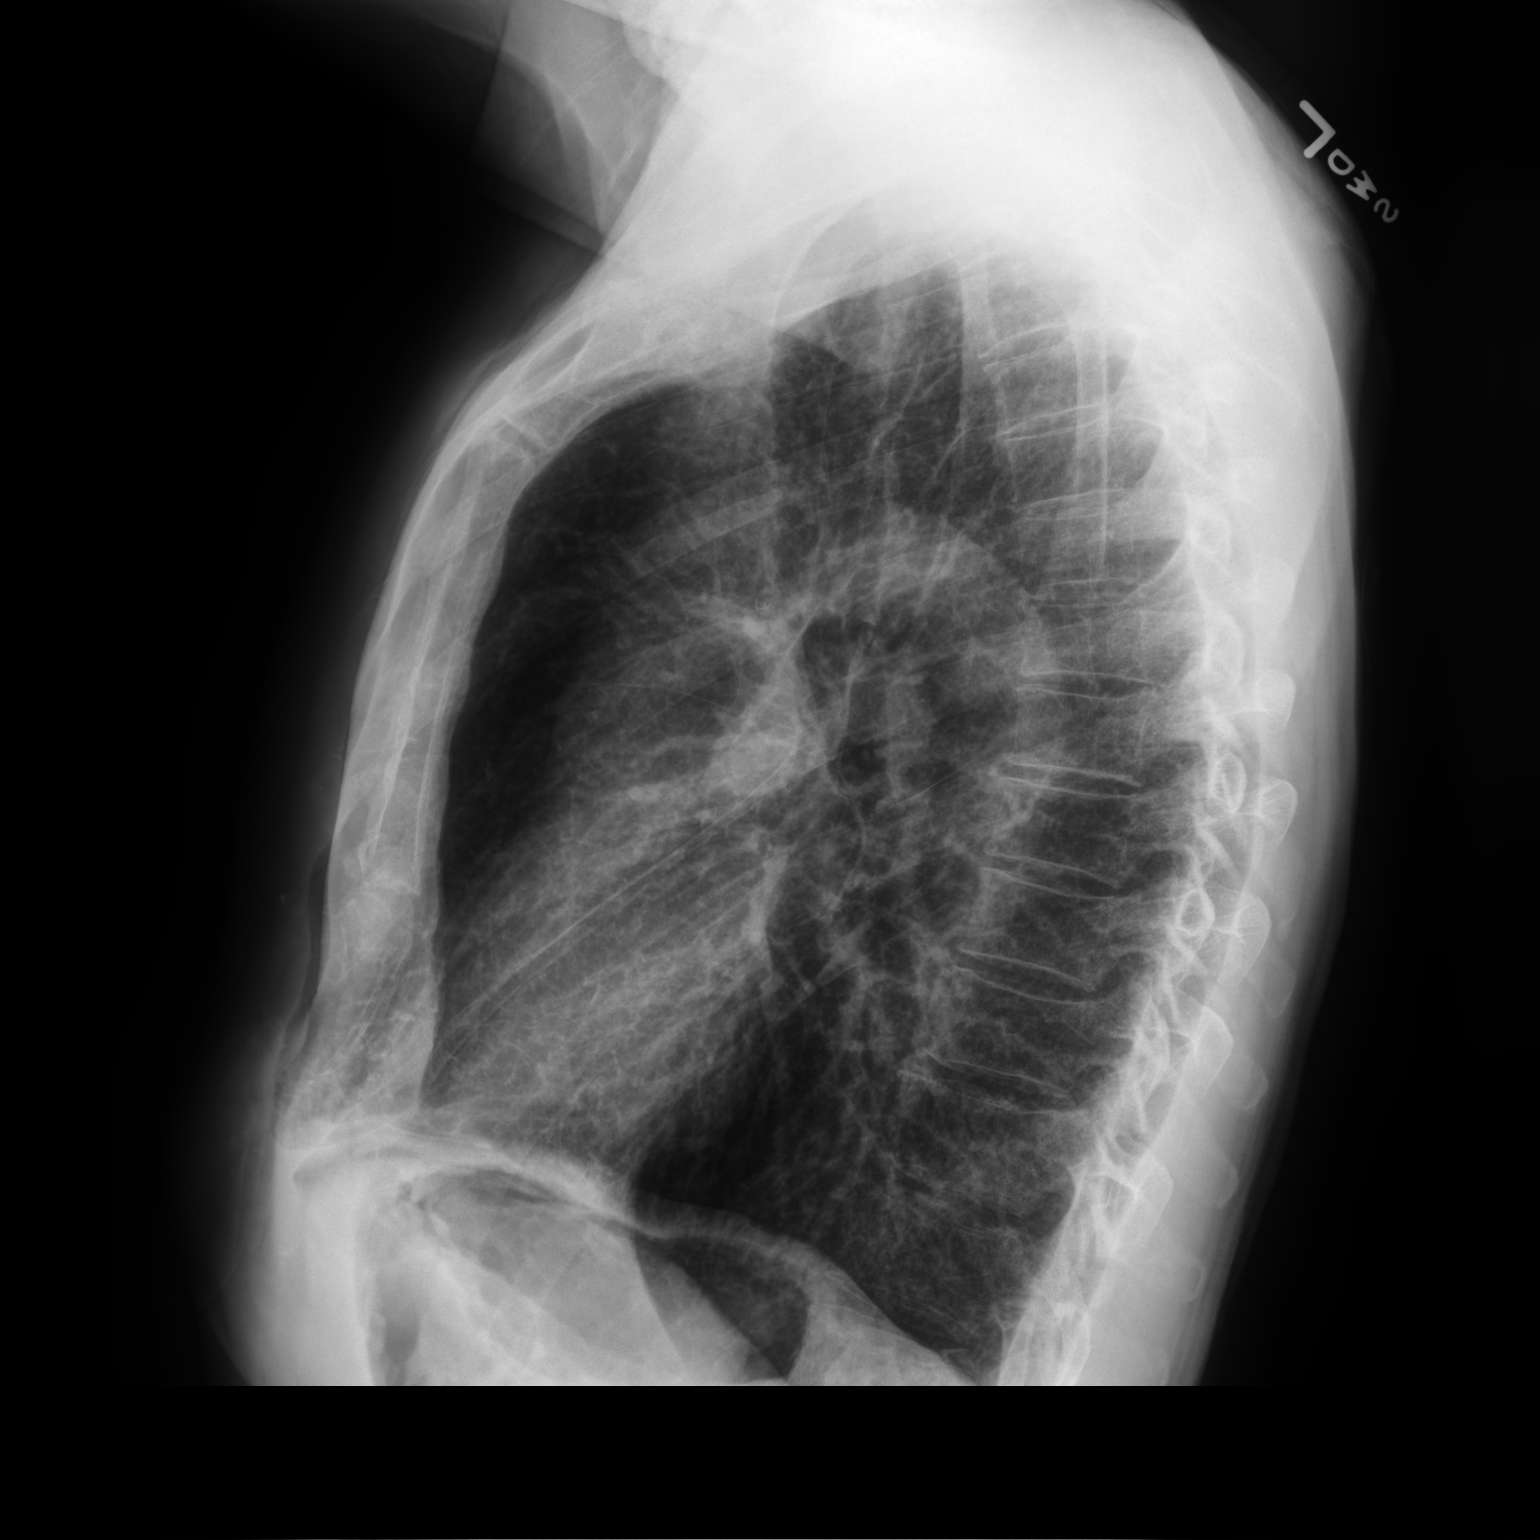

[2 of 2 positions shown; findings below may reference images not displayed]

FINDINGS: The heart size and mediastinal contours are within normal limits.
Both lungs are clear. The visualized skeletal structures are
unremarkable.
IMPRESSION: No active cardiopulmonary disease.

## 2023-07-16 ENCOUNTER — Other Ambulatory Visit: Payer: Self-pay | Admitting: Nurse Practitioner

## 2023-07-16 DIAGNOSIS — I714 Abdominal aortic aneurysm, without rupture, unspecified: Secondary | ICD-10-CM

## 2023-07-23 ENCOUNTER — Encounter: Payer: Self-pay | Admitting: Nurse Practitioner

## 2023-08-08 ENCOUNTER — Encounter: Payer: Self-pay | Admitting: Nurse Practitioner

## 2023-08-10 ENCOUNTER — Ambulatory Visit
Admission: RE | Admit: 2023-08-10 | Discharge: 2023-08-10 | Disposition: A | Discharge status: Home / Self Care | Source: Ambulatory Visit | Attending: Nurse Practitioner | Admitting: Nurse Practitioner

## 2023-08-10 DIAGNOSIS — I714 Abdominal aortic aneurysm, without rupture, unspecified: Secondary | ICD-10-CM

## 2023-08-10 MED ORDER — IOPAMIDOL (ISOVUE-370) INJECTION 76%
100.0000 mL | Freq: Once | INTRAVENOUS | Status: AC | PRN
  Administered 2023-08-10: 100 mL via INTRAVENOUS

## 2024-05-24 ENCOUNTER — Emergency Department (HOSPITAL_COMMUNITY)

## 2024-05-24 ENCOUNTER — Other Ambulatory Visit: Payer: Self-pay

## 2024-05-24 ENCOUNTER — Emergency Department (HOSPITAL_COMMUNITY)
Admission: EM | Admit: 2024-05-24 | Discharge: 2024-05-24 | Disposition: A | Discharge status: Home / Self Care | Attending: Emergency Medicine | Admitting: Emergency Medicine

## 2024-05-24 ENCOUNTER — Encounter (HOSPITAL_COMMUNITY): Payer: Self-pay

## 2024-05-24 DIAGNOSIS — X58XXXA Exposure to other specified factors, initial encounter: Secondary | ICD-10-CM | POA: Diagnosis not present

## 2024-05-24 DIAGNOSIS — E860 Dehydration: Secondary | ICD-10-CM | POA: Diagnosis not present

## 2024-05-24 DIAGNOSIS — R55 Syncope and collapse: Secondary | ICD-10-CM | POA: Diagnosis present

## 2024-05-24 DIAGNOSIS — S51012A Laceration without foreign body of left elbow, initial encounter: Secondary | ICD-10-CM | POA: Insufficient documentation

## 2024-05-24 LAB — CBC WITH DIFFERENTIAL/PLATELET
Abs Immature Granulocytes: 0.07 K/uL (ref 0.00–0.07)
Basophils Absolute: 0 K/uL (ref 0.0–0.1)
Basophils Relative: 0 %
Eosinophils Absolute: 0 K/uL (ref 0.0–0.5)
Eosinophils Relative: 0 %
HCT: 43.1 % (ref 39.0–52.0)
Hemoglobin: 14.8 g/dL (ref 13.0–17.0)
Immature Granulocytes: 1 %
Lymphocytes Relative: 7 %
Lymphs Abs: 0.9 K/uL (ref 0.7–4.0)
MCH: 31.6 pg (ref 26.0–34.0)
MCHC: 34.3 g/dL (ref 30.0–36.0)
MCV: 91.9 fL (ref 80.0–100.0)
Monocytes Absolute: 0.9 K/uL (ref 0.1–1.0)
Monocytes Relative: 7 %
Neutro Abs: 11.9 K/uL — ABNORMAL HIGH (ref 1.7–7.7)
Neutrophils Relative %: 85 %
Platelets: 238 K/uL (ref 150–400)
RBC: 4.69 MIL/uL (ref 4.22–5.81)
RDW: 13.2 % (ref 11.5–15.5)
WBC: 13.9 K/uL — ABNORMAL HIGH (ref 4.0–10.5)
nRBC: 0 % (ref 0.0–0.2)

## 2024-05-24 LAB — COMPREHENSIVE METABOLIC PANEL WITH GFR
ALT: 35 U/L (ref 0–44)
AST: 28 U/L (ref 15–41)
Albumin: 4.1 g/dL (ref 3.5–5.0)
Alkaline Phosphatase: 118 U/L (ref 38–126)
Anion gap: 9 (ref 5–15)
BUN: 25 mg/dL — ABNORMAL HIGH (ref 8–23)
CO2: 24 mmol/L (ref 22–32)
Calcium: 8.7 mg/dL — ABNORMAL LOW (ref 8.9–10.3)
Chloride: 101 mmol/L (ref 98–111)
Creatinine, Ser: 1.73 mg/dL — ABNORMAL HIGH (ref 0.61–1.24)
GFR, Estimated: 41 mL/min — ABNORMAL LOW
Glucose, Bld: 169 mg/dL — ABNORMAL HIGH (ref 70–99)
Potassium: 4.8 mmol/L (ref 3.5–5.1)
Sodium: 133 mmol/L — ABNORMAL LOW (ref 135–145)
Total Bilirubin: 0.6 mg/dL (ref 0.0–1.2)
Total Protein: 6.9 g/dL (ref 6.5–8.1)

## 2024-05-24 LAB — TROPONIN T, HIGH SENSITIVITY: Troponin T High Sensitivity: <15 ng/L (ref 0–19)

## 2024-05-24 LAB — LIPASE, BLOOD: Lipase: 18 U/L (ref 11–51)

## 2024-05-24 MED ORDER — LACTATED RINGERS IV BOLUS
1000.0000 mL | Freq: Once | INTRAVENOUS | Status: AC
  Administered 2024-05-24: 1000 mL via INTRAVENOUS

## 2024-05-24 NOTE — ED Triage Notes (Signed)
 Pt bib ems from home c/o syncopal episode. Pt fell and injured his elbow. Pt felt dizzy when going to restroom and fell down.  Pt started having cp after falling that was midsternal with N/V  324mg  aspirin  PTA   No thinner  500 cc NS 4 mg Zofran   SBP 110 seated SBP 92 standing+  BP 115/74 HR 84 RA 98% CBG 159  18 G LF AC

## 2024-05-24 NOTE — ED Provider Notes (Signed)
 "  EMERGENCY DEPARTMENT AT [facility] Provider Note   CSN: [id number] Arrival date & time: 05/24/24  0454     History Chief Complaint  Patient presents with   Loss of Consciousness    HPI Jacob Barr is a 74 y.o. male presenting for syncopal event.  States he went up to go to the bathroom and syncopized.  Had a prodrome of nausea and lightheadedness.  History of similar attributed to orthostatic hypotension.  Has not had an event in a few years he reports.  Denies fevers chills nausea vomiting syncope shortness of breath at this time.  Was given IV fluids with EMS for low blood pressure 90s over 50s and now feels much better..   Patient's recorded medical, surgical, social, medication list and allergies were reviewed in the Snapshot window as part of the initial history.   Review of Systems   Review of Systems  Constitutional:  Negative for chills and fever.  HENT:  Negative for ear pain and sore throat.   Eyes:  Negative for pain and visual disturbance.  Respiratory:  Negative for cough and shortness of breath.   Cardiovascular:  Negative for chest pain and palpitations.  Gastrointestinal:  Negative for abdominal pain and vomiting.  Genitourinary:  Negative for dysuria and hematuria.  Musculoskeletal:  Negative for arthralgias and back pain.  Skin:  Negative for color change and rash.  Neurological:  Positive for syncope. Negative for seizures.  All other systems reviewed and are negative.   Physical Exam Updated Vital Signs BP (!) 113/95   Pulse 71   Temp 97.6 F (36.4 C) (Oral)   Resp 18   Ht 5' 6 (1.676 m)   Wt 77.1 kg   SpO2 100%   BMI 27.44 kg/m  Physical Exam Vitals and nursing note reviewed.  Constitutional:      General: He is not in acute distress.    Appearance: He is well-developed.  HENT:     Head: Normocephalic and atraumatic.  Eyes:     Conjunctiva/sclera: Conjunctivae normal.  Cardiovascular:     Rate and Rhythm: Normal  rate and regular rhythm.     Heart sounds: No murmur heard. Pulmonary:     Effort: Pulmonary effort is normal. No respiratory distress.     Breath sounds: Normal breath sounds.  Abdominal:     Palpations: Abdomen is soft.     Tenderness: There is no abdominal tenderness.  Musculoskeletal:        General: Signs of injury (3 different skin tears to his left elbow all approximately 3 cm in length.  Nothing amenable to laceration closure.) present. No swelling.     Cervical back: Neck supple.  Skin:    General: Skin is warm and dry.     Capillary Refill: Capillary refill takes less than 2 seconds.  Neurological:     Mental Status: He is alert.  Psychiatric:        Mood and Affect: Mood normal.      ED Course/ Medical Decision Making/ A&P    Procedures Procedures   Medications Ordered in ED Medications  lactated ringers  bolus 1,000 mL (1,000 mLs Intravenous New Bag/Given 05/24/24 9376)   Medical Decision Making:   Jacob Barr is a 74 y.o. male who presented to the ED today with a syncopal episode detailed above.    Patient placed on continuous vitals and telemetry monitoring while in ED which was reviewed periodically.  Complete initial physical exam performed,  notably the patient  was HDS in NAD.    Reviewed and confirmed nursing documentation for past medical history, family history, social history.    Initial Assessment:   With the patient's presentation of syncope, most likely diagnosis is orthostatic hypotension vs vasovagal episode. Other diagnoses were considered including (but not limited to) arrythmogenic syncope, valvular abnormality, PE, aortic dissection. These are considered less likely due to history of present illness and physical exam findings.   This is most consistent with an acute life/limb threatening illness complicated by underlying chronic conditions. Additionally, patient's history appears more consistent with benign episodes including orthostatic event  vs  vagal episode based on HPI/PE findings.  Initial Plan:  Screening labs including CBC and Metabolic panel to evaluate for infectious or metabolic etiology of disease.  CXR to evaluate for structural/infectious intrathoracic pathology.  Left elbow XR due to localized injury EKG and single troponin to evaluate for cardiac pathology. Single troponin appropriate due to greater than 6 hours since maximal intensity of symptoms. Objective evaluation as below reviewed after administration of IVF/Telemetry monitoring  Initial Study Results:   Laboratory  All laboratory results reviewed without evidence of clinically relevant pathology.    EKG EKG was reviewed independently. Rate, rhythm, axis, intervals all examined and without medically relevant abnormality. ST segments without concerns for elevations.    Radiology:  All images reviewed independently. Agree with radiology report at this time.   DG Elbow Complete Left Result Date: 05/24/2024 EXAM: 3 VIEW(S) XRAY OF THE LEFT ELBOW COMPARISON: None available. CLINICAL HISTORY: fall FINDINGS: BONES AND JOINTS: No joint effusion No acute fracture. No malalignment. SOFT TISSUES: Unremarkable. IMPRESSION: 1. No acute fracture or dislocation. Electronically signed by: Waddell Calk MD 05/24/2024 05:31 AM EST RP Workstation: [id number]   DG Chest Portable 1 View Result Date: 05/24/2024 EXAM: 1 VIEW(S) XRAY OF THE CHEST 05/24/2024 05:25:00 AM COMPARISON: 06/20/2020 CLINICAL HISTORY: Syncope/Chest Pain Syncope/Chest Pain Syncope/Chest Pain FINDINGS: LUNGS AND PLEURA: No focal pulmonary opacity. No pleural effusion. No pneumothorax. HEART AND MEDIASTINUM: Atherosclerotic calcifications. No acute abnormality of the cardiac and mediastinal silhouettes. BONES AND SOFT TISSUES: No acute osseous abnormality. IMPRESSION: 1. No acute findings. Electronically signed by: Waddell Calk MD 05/24/2024 05:30 AM EST RP Workstation: [id number]     Final Assessment and  Plan:   Patient overall very well-appearing on serial assessment.  Blood pressures remained benign on serial assessments. Patient only minimally symptomatic.  Stable for outpatient care management.  Clinical Impression:  1. Syncope and collapse   2. Dehydration      Data Unavailable    Final Clinical Impression(s) / ED Diagnoses Final diagnoses:  Syncope and collapse  Dehydration    Rx / DC Orders ED Discharge Orders     None         Jerral Meth, MD 05/24/24 [phone]  "

## 2024-05-24 NOTE — ED Notes (Signed)
 Pt states he did blackout and unsure how long he was out. Pt just know when he came to he was nauseous and had cp.  Pt had an episode like this a few years back without the cp.   Pt states his last dx orthostatic hypotension  Pt states no changes to lifestyle. Pt says he always had a small appetite. He isn't taking any medications.
